# Patient Record
Sex: Female | Born: 1985 | Hispanic: Yes | Smoking: Never smoker
Health system: Southern US, Community
[De-identification: ages and names within clinical notes are randomized; demographics above are authoritative.]

## PROBLEM LIST (undated history)

## (undated) DIAGNOSIS — O4403 Placenta previa specified as without hemorrhage, third trimester: Secondary | ICD-10-CM

## (undated) DIAGNOSIS — Z789 Other specified health status: Secondary | ICD-10-CM

---

## 2009-09-28 ENCOUNTER — Emergency Department: Payer: Self-pay | Admitting: Internal Medicine

## 2009-09-30 ENCOUNTER — Ambulatory Visit: Payer: Self-pay | Admitting: Internal Medicine

## 2009-12-13 ENCOUNTER — Ambulatory Visit: Payer: Self-pay | Admitting: Family Medicine

## 2010-05-22 ENCOUNTER — Inpatient Hospital Stay: Payer: Self-pay

## 2016-04-13 ENCOUNTER — Emergency Department
Admission: EM | Admit: 2016-04-13 | Discharge: 2016-04-13 | Disposition: A | Discharge status: Home / Self Care | Payer: Self-pay | Attending: Emergency Medicine | Admitting: Emergency Medicine

## 2016-04-13 ENCOUNTER — Emergency Department: Payer: Self-pay

## 2016-04-13 ENCOUNTER — Encounter: Payer: Self-pay | Admitting: Emergency Medicine

## 2016-04-13 DIAGNOSIS — R52 Pain, unspecified: Secondary | ICD-10-CM

## 2016-04-13 DIAGNOSIS — R58 Hemorrhage, not elsewhere classified: Secondary | ICD-10-CM

## 2016-04-13 DIAGNOSIS — Z3A01 Less than 8 weeks gestation of pregnancy: Secondary | ICD-10-CM | POA: Insufficient documentation

## 2016-04-13 DIAGNOSIS — O209 Hemorrhage in early pregnancy, unspecified: Secondary | ICD-10-CM | POA: Insufficient documentation

## 2016-04-13 DIAGNOSIS — O2391 Unspecified genitourinary tract infection in pregnancy, first trimester: Secondary | ICD-10-CM | POA: Insufficient documentation

## 2016-04-13 DIAGNOSIS — N390 Urinary tract infection, site not specified: Secondary | ICD-10-CM

## 2016-04-13 LAB — TYPE AND SCREEN
ABO/RH(D): A POS
Antibody Screen: NEGATIVE

## 2016-04-13 LAB — CBC WITH DIFFERENTIAL/PLATELET
BASOS ABS: 0 10*3/uL (ref 0–0.1)
Basophils Relative: 0 %
EOS PCT: 1 %
Eosinophils Absolute: 0 10*3/uL (ref 0–0.7)
HEMATOCRIT: 37.1 % (ref 35.0–47.0)
Hemoglobin: 12.6 g/dL (ref 12.0–16.0)
LYMPHS ABS: 1.3 10*3/uL (ref 1.0–3.6)
LYMPHS PCT: 18 %
MCH: 28.1 pg (ref 26.0–34.0)
MCHC: 34.1 g/dL (ref 32.0–36.0)
MCV: 82.4 fL (ref 80.0–100.0)
MONO ABS: 0.6 10*3/uL (ref 0.2–0.9)
MONOS PCT: 8 %
NEUTROS ABS: 5.4 10*3/uL (ref 1.4–6.5)
Neutrophils Relative %: 73 %
Platelets: 175 10*3/uL (ref 150–440)
RBC: 4.5 MIL/uL (ref 3.80–5.20)
RDW: 14.3 % (ref 11.5–14.5)
WBC: 7.4 10*3/uL (ref 3.6–11.0)

## 2016-04-13 LAB — COMPREHENSIVE METABOLIC PANEL
ALBUMIN: 4.1 g/dL (ref 3.5–5.0)
ALT: 19 U/L (ref 14–54)
ANION GAP: 6 (ref 5–15)
AST: 16 U/L (ref 15–41)
Alkaline Phosphatase: 42 U/L (ref 38–126)
BUN: 13 mg/dL (ref 6–20)
CHLORIDE: 105 mmol/L (ref 101–111)
CO2: 24 mmol/L (ref 22–32)
Calcium: 9.2 mg/dL (ref 8.9–10.3)
Creatinine, Ser: 0.59 mg/dL (ref 0.44–1.00)
GFR calc Af Amer: >60 mL/min (ref >60)
GFR calc non Af Amer: >60 mL/min (ref >60)
GLUCOSE: 94 mg/dL (ref 65–99)
POTASSIUM: 3.8 mmol/L (ref 3.5–5.1)
SODIUM: 135 mmol/L (ref 135–145)
Total Bilirubin: 0.5 mg/dL (ref 0.3–1.2)
Total Protein: 6.6 g/dL (ref 6.5–8.1)

## 2016-04-13 LAB — WET PREP, GENITAL
Clue Cells Wet Prep HPF POC: NONE SEEN
SPERM: NONE SEEN
Trich, Wet Prep: NONE SEEN
Yeast Wet Prep HPF POC: NONE SEEN

## 2016-04-13 LAB — HCG, QUANTITATIVE, PREGNANCY: hCG, Beta Chain, Quant, S: 29832 m[IU]/mL — ABNORMAL HIGH (ref <5)

## 2016-04-13 LAB — URINALYSIS COMPLETE WITH MICROSCOPIC (ARMC ONLY)
BILIRUBIN URINE: NEGATIVE
Glucose, UA: NEGATIVE mg/dL
Ketones, ur: NEGATIVE mg/dL
Nitrite: NEGATIVE
PH: 8 (ref 5.0–8.0)
PROTEIN: NEGATIVE mg/dL
Specific Gravity, Urine: 1.014 (ref 1.005–1.030)

## 2016-04-13 LAB — CHLAMYDIA/NGC RT PCR (ARMC ONLY)
CHLAMYDIA TR: NOT DETECTED
N GONORRHOEAE: NOT DETECTED

## 2016-04-13 MED ORDER — CEPHALEXIN 500 MG PO CAPS: 500.0000 mg | ORAL_CAPSULE | Freq: Four times a day (QID) | ORAL | 0 refills | Status: AC

## 2016-04-13 NOTE — ED Provider Notes (Signed)
[facility] Emergency Department Provider Note   ____________________________________________   First MD Initiated Contact with Patient 04/13/16 [phone]     (approximate)  I have reviewed the triage vital signs and the nursing notes.   HISTORY  Chief Complaint Vaginal Bleeding and Back Pain    HPI Christine Delgado is a 30 y.o. female patient reports a little bit of low back pain early this morning and spotting what once was pink and the other time when she wiped after urinating was brown. She has no further pain no bleeding at present. She had a positive pregnancy test at home. She is worried she might have miscarried. She is gravida 21 live child at home   History reviewed. No pertinent past medical history.  There are no active problems to display for this patient.   History reviewed. No pertinent surgical history.  Prior to Admission medications   Medication Sig Start Date End Date Taking? Authorizing Provider  cephALEXin (KEFLEX) 500 MG capsule Take 1 capsule (500 mg total) by mouth 4 (four) times daily. 04/13/16 04/23/16  Arnaldo NatalPaul F Malinda, MD    Allergies Review of patient's allergies indicates no known allergies.  No family history on file.  Social History Social History  Substance Use Topics  . Smoking status: Never Smoker  . Smokeless tobacco: Never Used  . Alcohol use No    Review of Systems Constitutional: No fever/chills Eyes: No visual changes. ENT: No sore throat. Cardiovascular: Denies chest pain. Respiratory: Denies shortness of breath. Gastrointestinal: No abdominal pain.  No nausea, no vomiting.  No diarrhea.  No constipation. Genitourinary: Negative for dysuria. Musculoskeletal: Negative for back painAt present. Skin: Negative for rash. Neurological: Negative for headaches, focal weakness or numbness.  10-point ROS otherwise negative.  ____________________________________________   PHYSICAL EXAM:  VITAL SIGNS: ED  Triage Vitals  Enc Vitals Group     BP 04/13/16 0843 137/88     Pulse Rate 04/13/16 0843 91     Resp 04/13/16 0843 16     Temp 04/13/16 0843 97.5 F (36.4 C)     Temp Source 04/13/16 0843 Oral     SpO2 04/13/16 0843 99 %     Weight 04/13/16 0843 145 lb (65.8 kg)     Height 04/13/16 0843 5\' 4"  (1.626 m)     Head Circumference --      Peak Flow --      Pain Score 04/13/16 0845 4     Pain Loc --      Pain Edu? --      Excl. in GC? --     Constitutional: Alert and oriented. Well appearing and in no acute distress. Eyes: Conjunctivae are normal. PERRL. EOMI. Head: Atraumatic. Nose: No congestion/rhinnorhea. Mouth/Throat: Mucous membranes are moist.  Oropharynx non-erythematous. Neck: No stridor. Cardiovascular: Normal rate, regular rhythm. Grossly normal heart sounds.  Good peripheral circulation. Respiratory: Normal respiratory effort.  No retractions. Lungs CTAB. Gastrointestinal: Soft and nontender. No distention. No abdominal bruits. No CVA tenderness. Genitourinary: Normal peritoneum. Very small amount of dark blood deep in the vagina. No cervical motion tenderness no adnexal tenderness uterus is normal size Musculoskeletal: No lower extremity tenderness nor edema.  No joint effusions. Neurologic:  Normal speech and language. No gross focal neurologic deficits are appreciated. No gait instability. Skin:  Skin is warm, dry and intact. No rash noted.   ____________________________________________   LABS (all labs ordered are listed, but only abnormal results are displayed)  Labs Reviewed  WET  PREP, GENITAL - Abnormal; Notable for the following:       Result Value   WBC, Wet Prep HPF POC FEW (*)    All other components within normal limits  URINALYSIS COMPLETEWITH MICROSCOPIC ([facility] ONLY) - Abnormal; Notable for the following:    Color, Urine YELLOW (*)    APPearance CLOUDY (*)    Hgb urine dipstick 2+ (*)    Leukocytes, UA 3+ (*)    Bacteria, UA RARE (*)    Squamous  Epithelial / LPF 6-30 (*)    All other components within normal limits  HCG, QUANTITATIVE, PREGNANCY - Abnormal; Notable for the following:    hCG, Beta Chain, Sharene Butters, S 65,784 (*)    All other components within normal limits  CHLAMYDIA/NGC RT PCR ([facility] ONLY)  URINE CULTURE  COMPREHENSIVE METABOLIC PANEL  CBC WITH DIFFERENTIAL/PLATELET  TYPE AND SCREEN   ____________________________________________  EKG   ____________________________________________  RADIOLOGY  Ultrasound read by radiology intrauterine pregnancy viable ____________________________________________   PROCEDURES  Procedure(s) performed: Procedures  Critical Care performed:   ____________________________________________   INITIAL IMPRESSION / ASSESSMENT AND PLAN / ED COURSE  Pertinent labs & imaging results that were available during my care of the patient were reviewed by me and considered in my medical decision making (see chart for details).  Clinical Course     ____________________________________________   FINAL CLINICAL IMPRESSION(S) / ED DIAGNOSES  Final diagnoses:  Vaginal bleeding in pregnancy, first trimester  Urinary tract infection without hematuria, site unspecified      NEW MEDICATIONS STARTED DURING THIS VISIT:  New Prescriptions   CEPHALEXIN (KEFLEX) 500 MG CAPSULE    Take 1 capsule (500 mg total) by mouth 4 (four) times daily.     Note:  This document was prepared using Dragon voice recognition software and may include unintentional dictation errors.    Arnaldo Natal, MD 04/13/16 [phone]

## 2016-04-13 NOTE — ED Notes (Signed)
Pt tearful and reports she is nervous she may have had a miscarriage. MD and RN educated pt on the tests we are running. Pt reports no previous miscarriages, did spot with her first child.

## 2016-04-13 NOTE — ED Triage Notes (Addendum)
Patient states last night after voiding, saw pink on the tissue.  Today c/o low back pain and today seeing dark brown on tissue after voiding.  Patient states had a positive home pregnancy test, LMP:  02/28/16.  Denies c/o abdominal pain.  [facility] and treated for UTI at that time.

## 2016-04-13 NOTE — Discharge Instructions (Signed)
Use the Keflex antibiotic one pill re-times a day to treat what appears to be a bladder infection. Please call Dr. Feliberto GottronSchermerhorn tomorrow to schedule follow-up with him. Please return here for any worse pain heavier bleeding or any other problems, especially fever or vomiting.

## 2016-04-15 LAB — URINE CULTURE

## 2016-04-23 MED ORDER — ESCITALOPRAM OXALATE 10 MG PO TABS
ORAL_TABLET | ORAL | Status: AC
  Filled 2016-04-23: qty 2

## 2016-04-23 MED ORDER — OLANZAPINE 5 MG PO TABS
ORAL_TABLET | ORAL | Status: AC
  Filled 2016-04-23: qty 1

## 2016-04-23 MED ORDER — DIVALPROEX SODIUM 500 MG PO DR TAB
DELAYED_RELEASE_TABLET | ORAL | Status: AC
  Filled 2016-04-23: qty 1

## 2016-05-28 LAB — OB RESULTS CONSOLE HEPATITIS B SURFACE ANTIGEN: Hepatitis B Surface Ag: NEGATIVE

## 2016-05-28 LAB — OB RESULTS CONSOLE VARICELLA ZOSTER ANTIBODY, IGG: Varicella: IMMUNE

## 2016-05-28 LAB — OB RESULTS CONSOLE RUBELLA ANTIBODY, IGM: RUBELLA: IMMUNE

## 2016-05-28 LAB — OB RESULTS CONSOLE RPR: RPR: NONREACTIVE

## 2016-07-15 ENCOUNTER — Other Ambulatory Visit: Payer: Self-pay | Admitting: Family Medicine

## 2016-07-15 ENCOUNTER — Ambulatory Visit
Admission: RE | Admit: 2016-07-15 | Discharge: 2016-07-15 | Disposition: A | Discharge status: Home / Self Care | Payer: Medicaid Other | Source: Ambulatory Visit | Attending: Family Medicine | Admitting: Family Medicine

## 2016-07-15 ENCOUNTER — Emergency Department
Admission: EM | Admit: 2016-07-15 | Discharge: 2016-07-15 | Disposition: A | Discharge status: Home / Self Care | Payer: Self-pay | Attending: Emergency Medicine | Admitting: Emergency Medicine

## 2016-07-15 DIAGNOSIS — Z3A19 19 weeks gestation of pregnancy: Secondary | ICD-10-CM | POA: Insufficient documentation

## 2016-07-15 DIAGNOSIS — O4402 Placenta previa specified as without hemorrhage, second trimester: Secondary | ICD-10-CM | POA: Insufficient documentation

## 2016-07-15 DIAGNOSIS — N939 Abnormal uterine and vaginal bleeding, unspecified: Secondary | ICD-10-CM

## 2016-07-15 DIAGNOSIS — O209 Hemorrhage in early pregnancy, unspecified: Secondary | ICD-10-CM | POA: Diagnosis present

## 2016-07-15 DIAGNOSIS — O4412 Placenta previa with hemorrhage, second trimester: Secondary | ICD-10-CM | POA: Insufficient documentation

## 2016-07-15 LAB — CBC WITH DIFFERENTIAL/PLATELET
Basophils Absolute: 0.1 10*3/uL (ref 0–0.1)
Basophils Relative: 1 %
EOS ABS: 0.1 10*3/uL (ref 0–0.7)
Eosinophils Relative: 1 %
HEMATOCRIT: 36.2 % (ref 35.0–47.0)
HEMOGLOBIN: 12.4 g/dL (ref 12.0–16.0)
LYMPHS ABS: 2.2 10*3/uL (ref 1.0–3.6)
Lymphocytes Relative: 20 %
MCH: 28.4 pg (ref 26.0–34.0)
MCHC: 34.1 g/dL (ref 32.0–36.0)
MCV: 83.1 fL (ref 80.0–100.0)
MONOS PCT: 6 %
Monocytes Absolute: 0.7 10*3/uL (ref 0.2–0.9)
NEUTROS ABS: 8 10*3/uL — AB (ref 1.4–6.5)
NEUTROS PCT: 72 %
Platelets: 190 10*3/uL (ref 150–440)
RBC: 4.36 MIL/uL (ref 3.80–5.20)
RDW: 14.8 % — ABNORMAL HIGH (ref 11.5–14.5)
WBC: 11 10*3/uL (ref 3.6–11.0)

## 2016-07-15 LAB — HCG, QUANTITATIVE, PREGNANCY: hCG, Beta Chain, Quant, S: 19660 m[IU]/mL — ABNORMAL HIGH (ref <5)

## 2016-07-15 NOTE — ED Notes (Signed)
Pt reports bleeding started around 10am this morning.  States filled about 1 pad today.  Reports minor dizziness. 3/10 lower abd pain.

## 2016-07-15 NOTE — ED Triage Notes (Signed)
Pt sent from outpatient ultrasound with complete previa, with vaginal bleeding since 9am this morning.. Pt c/o cramping.. States she is 19weeks 5days..Christine Delgado

## 2016-07-15 NOTE — ED Provider Notes (Signed)
[facility] Emergency Department Provider Note  ____________________________________________   First MD Initiated Contact with Patient 07/15/16 1622     (approximate)  I have reviewed the triage vital signs and the nursing notes.   HISTORY  Chief Complaint Vaginal Bleeding    HPI Christine Delgado is a 30 y.o. female G2P1who is at approximately 9 weeks' gestation and was sent to the emergency department after obtaining an outpatient ultrasound demonstrating complete placenta previa.  She was seen by her [facility] provider [facility] after she developed a small amount of acute onset vaginal bleeding earlier this morning.  The ultrasound was ordered and when the result came back of complete placenta previa she was sent here for further evaluation.  She states she has had no additional bleeding since the initial blood that required the use of one pad.  She urinated after coming to the emergency department and did not see any additional blood.  She had 1 brief episode of abdominal pain on the lower right side that felt like a cramp but that has not continued.  She denies fever/chills, chest pain, shortness of breath, nausea, vomiting, diarrhea, dysuria.   History reviewed. No pertinent past medical history.  There are no active problems to display for this patient.   History reviewed. No pertinent surgical history.  Prior to Admission medications   Not on File    Allergies Patient has no known allergies.  No family history on file.  Social History Social History  Substance Use Topics  . Smoking status: Never Smoker  . Smokeless tobacco: Never Used  . Alcohol use No    Review of Systems Constitutional: No fever/chills Eyes: No visual changes. ENT: No sore throat. Cardiovascular: Denies chest pain. Respiratory: Denies shortness of breath. Gastrointestinal: No abdominal pain.  No nausea, no vomiting.  No diarrhea.  No  constipation. Genitourinary: Negative for dysuria. Small amount of vaginal bleeding at [redacted] weeks gestation Musculoskeletal: Negative for back pain. Skin: Negative for rash. Neurological: Negative for headaches, focal weakness or numbness.  10-point ROS otherwise negative.  ____________________________________________   PHYSICAL EXAM:  VITAL SIGNS: ED Triage Vitals  Enc Vitals Group     BP 07/15/16 1607 115/66     Pulse Rate 07/15/16 1607 88     Resp 07/15/16 1607 18     Temp 07/15/16 1607 98.7 F (37.1 C)     Temp Source 07/15/16 1607 Oral     SpO2 07/15/16 1607 100 %     Weight 07/15/16 1608 152 lb (68.9 kg)     Height 07/15/16 1608 5\' 4"  (1.626 m)     Head Circumference --      Peak Flow --      Pain Score 07/15/16 1609 4     Pain Loc --      Pain Edu? --      Excl. in GC? --     Constitutional: Alert and oriented. Well appearing and in no acute distress. Eyes: Conjunctivae are normal. PERRL. EOMI. Head: Atraumatic. Nose: No congestion/rhinnorhea. Mouth/Throat: Mucous membranes are moist.  Oropharynx non-erythematous. Neck: No stridor.  No meningeal signs.   Cardiovascular: Normal rate, regular rhythm. Good peripheral circulation. Grossly normal heart sounds. Respiratory: Normal respiratory effort.  No retractions. Lungs CTAB. Gastrointestinal: Soft and nontender. No distention.  Genitourinary: Deferred Musculoskeletal: No lower extremity tenderness nor edema. No gross deformities of extremities. Neurologic:  Normal speech and language. No gross focal neurologic deficits are appreciated.  Skin:  Skin  is warm, dry and intact. No rash noted. Psychiatric: Mood and affect are normal. Speech and behavior are normal.  ____________________________________________   LABS (all labs ordered are listed, but only abnormal results are displayed)  Labs Reviewed  CBC WITH DIFFERENTIAL/PLATELET - Abnormal; Notable for the following:       Result Value   RDW 14.8 (*)     Neutro Abs 8.0 (*)    All other components within normal limits  HCG, QUANTITATIVE, PREGNANCY - Abnormal; Notable for the following:    hCG, Beta Chain, Quant, S 19,660 (*)    All other components within normal limits   ____________________________________________  EKG  None - EKG not ordered by ED physician ____________________________________________  RADIOLOGY   US Ob Comp + 14 Wk  Result Date: 07/15/2016 CLINICAL DATA:  Vaginal bleeding. EXAM: OBSTETRICAL ULTRASOUND >14 WKS FINDINGS: Number of Fetuses: 1 Heart Rate:  140 bpm Movement: Present Presentation: Variable Previa: Complete placenta previa.  No evidence of abruption. Placental Location: Posterior Amniotic Fluid (Subjective): Normal. Amniotic Fluid (Objective): Vertical pocket 3.2cm FETAL BIOMETRY BPD:  4.4cm 19w 2d HC:    16.8cm  19w   3d AC:   14.8cm  20w 2 d FL:   3.0cm  19w   2d Current Mean GA: 19w 5d              [place] EDC: 12/04/2016 FETAL ANATOMY Lateral Ventricles: Visualized Thalami/CSP: Visualized Posterior Fossa:  Visualized Nuchal Region: Visualize    NFT= 3.66mm Upper Lip: Visualized Spine: Visualized 4 Chamber Heart on Left: Visualized LVOT: Visualized RVOT: Visualized Stomach on Left: Visualized 3 Vessel Cord: Visualized Cord Insertion site: Visualized Kidneys: Visualized Bladder: Visualized Extremities: Visualized Technically difficult due to: Fetal position Maternal Findings: Cervix:  3.9 cm and closed. IMPRESSION: 1.  Complete placenta previa.  No evidence of abruption. 2. Single viable intrauterine pregnancy at 19 weeks 5 days. Variable presentation. These results will be called to the ordering clinician or representative by the Radiologist Assistant, and communication documented in the PACS or zVision Dashboard. Electronically Signed   By: Maisie Fus  Register   On: 07/15/2016 15:35    ____________________________________________   PROCEDURES  Procedure(s) performed:   Procedures   Critical Care performed:  No ____________________________________________   INITIAL IMPRESSION / ASSESSMENT AND PLAN / ED COURSE  Pertinent labs & imaging results that were available during my care of the patient were reviewed by me and considered in my medical decision making (see chart for details).  I will defer a pelvic exam at this time in order to not cause any more potential trauma.  I called and spoke with the unassigned on-call OB/GYN, Dr. Thomasene Mohair, and we discussed the case.  He agreed that because she is not having any additional bleeding, as well as the fact that she is pretty viable, it is appropriate to discharge her if she is comfortable.  However she has any additional bleeding she should return, and if she is not comfortable or has additional bleeding now, he will bring her in for a brief period of observation.  I will discuss this with the patient.  She is Rh+ based on prior blood work.  Hemoglobin is normal.   Clinical Course as of Jul 15 1748  Tue Jul 15, 2016  1736 The patient has had no additional bleeding over the course of an hour and half in the emergency department.  She is comfortable with the plan to go home and will return to the ED if she  develops more bleeding and she will follow up with Dr. Jean RosenthalJackson.  I gave strict return precautions and I also encouraged her for complete pelvic rest and explained that means no sexual intercourse or any other objects in the vagina, nothing that could cause any trauma, and she understands and agrees.  [CF]    Clinical Course User Index [CF] Loleta Roseory Nitesh Pitstick, MD    ____________________________________________  FINAL CLINICAL IMPRESSION(S) / ED DIAGNOSES  Final diagnoses:  Complete placenta previa with hemorrhage, second trimester     MEDICATIONS GIVEN DURING THIS VISIT:  Medications - No data to display   NEW OUTPATIENT MEDICATIONS STARTED DURING THIS VISIT:  New Prescriptions   No medications on file    Modified Medications   No  medications on file    Discontinued Medications   No medications on file     Note:  This document was prepared using Dragon voice recognition software and may include unintentional dictation errors.    Loleta Roseory Tayven Renteria, MD 07/15/16 [phone]

## 2016-07-15 NOTE — ED Notes (Signed)
Pt discharged home after verbalizing understanding of discharge instructions; nad noted. 

## 2016-07-15 NOTE — Discharge Instructions (Signed)
Please follow up with Dr. Jean RosenthalJackson in about a week.  As we discussed, do not have sexual intercourse on put any objects into your vagina ("pelvic rest").   If you have more bleeding, especially if the bleeding is heavy, go to Dr. Edison PaceJackson's clinic or return immediately to the Emergency Department.

## 2016-09-10 ENCOUNTER — Other Ambulatory Visit: Payer: Self-pay | Admitting: Family Medicine

## 2016-09-10 DIAGNOSIS — O4412 Placenta previa with hemorrhage, second trimester: Secondary | ICD-10-CM

## 2016-09-11 LAB — OB RESULTS CONSOLE HIV ANTIBODY (ROUTINE TESTING): HIV: NONREACTIVE

## 2016-09-16 ENCOUNTER — Ambulatory Visit
Admission: RE | Admit: 2016-09-16 | Discharge: 2016-09-16 | Disposition: A | Discharge status: Home / Self Care | Payer: Self-pay | Source: Ambulatory Visit | Attending: Family Medicine | Admitting: Family Medicine

## 2016-09-16 DIAGNOSIS — O4423 Partial placenta previa NOS or without hemorrhage, third trimester: Secondary | ICD-10-CM | POA: Insufficient documentation

## 2016-09-16 DIAGNOSIS — O4412 Placenta previa with hemorrhage, second trimester: Secondary | ICD-10-CM

## 2016-09-16 DIAGNOSIS — Z3A28 28 weeks gestation of pregnancy: Secondary | ICD-10-CM | POA: Insufficient documentation

## 2016-11-27 ENCOUNTER — Inpatient Hospital Stay
Admission: EM | Admit: 2016-11-27 | Discharge: 2016-11-30 | DRG: 766 | Disposition: A | Discharge status: Home / Self Care | Payer: Medicaid Other | Attending: Obstetrics and Gynecology | Admitting: Obstetrics and Gynecology

## 2016-11-27 DIAGNOSIS — Z6790 Unspecified blood type, Rh positive: Secondary | ICD-10-CM

## 2016-11-27 DIAGNOSIS — D649 Anemia, unspecified: Secondary | ICD-10-CM | POA: Diagnosis present

## 2016-11-27 DIAGNOSIS — O4403 Placenta previa specified as without hemorrhage, third trimester: Principal | ICD-10-CM | POA: Diagnosis present

## 2016-11-27 DIAGNOSIS — O9902 Anemia complicating childbirth: Secondary | ICD-10-CM | POA: Diagnosis present

## 2016-11-27 DIAGNOSIS — Z3A39 39 weeks gestation of pregnancy: Secondary | ICD-10-CM

## 2016-11-28 ENCOUNTER — Inpatient Hospital Stay: Payer: Medicaid Other

## 2016-11-28 ENCOUNTER — Encounter
Admission: EM | Disposition: A | Discharge status: Home / Self Care | Payer: Self-pay | Source: Home / Self Care | Attending: Obstetrics and Gynecology

## 2016-11-28 ENCOUNTER — Encounter: Payer: Self-pay | Admitting: *Deleted

## 2016-11-28 ENCOUNTER — Inpatient Hospital Stay: Payer: Medicaid Other | Admitting: Anesthesiology

## 2016-11-28 DIAGNOSIS — O4403 Placenta previa specified as without hemorrhage, third trimester: Secondary | ICD-10-CM | POA: Diagnosis present

## 2016-11-28 DIAGNOSIS — D649 Anemia, unspecified: Secondary | ICD-10-CM | POA: Diagnosis present

## 2016-11-28 DIAGNOSIS — Z6790 Unspecified blood type, Rh positive: Secondary | ICD-10-CM | POA: Diagnosis not present

## 2016-11-28 DIAGNOSIS — Z3A39 39 weeks gestation of pregnancy: Secondary | ICD-10-CM | POA: Diagnosis not present

## 2016-11-28 DIAGNOSIS — Z3493 Encounter for supervision of normal pregnancy, unspecified, third trimester: Secondary | ICD-10-CM | POA: Diagnosis present

## 2016-11-28 DIAGNOSIS — O9902 Anemia complicating childbirth: Secondary | ICD-10-CM | POA: Diagnosis present

## 2016-11-28 LAB — CBC
HCT: 31.8 % — ABNORMAL LOW (ref 35.0–47.0)
HEMATOCRIT: 36.8 % (ref 35.0–47.0)
HEMOGLOBIN: 12.5 g/dL (ref 12.0–16.0)
Hemoglobin: 10.9 g/dL — ABNORMAL LOW (ref 12.0–16.0)
MCH: 28 pg (ref 26.0–34.0)
MCH: 28.7 pg (ref 26.0–34.0)
MCHC: 34 g/dL (ref 32.0–36.0)
MCHC: 34.3 g/dL (ref 32.0–36.0)
MCV: 82.4 fL (ref 80.0–100.0)
MCV: 83.6 fL (ref 80.0–100.0)
PLATELETS: 122 10*3/uL — AB (ref 150–440)
Platelets: 170 10*3/uL (ref 150–440)
RBC: 4.47 MIL/uL (ref 3.80–5.20)
RBC: 3.8 MIL/uL (ref 3.80–5.20)
RDW: 15.3 % — ABNORMAL HIGH (ref 11.5–14.5)
RDW: 15.2 % — ABNORMAL HIGH (ref 11.5–14.5)
WBC: 7.3 10*3/uL (ref 3.6–11.0)
WBC: 9 10*3/uL (ref 3.6–11.0)

## 2016-11-28 LAB — RAPID HIV SCREEN (HIV 1/2 AB+AG)
HIV 1/2 ANTIBODIES: NONREACTIVE
HIV-1 P24 Antigen - HIV24: NONREACTIVE

## 2016-11-28 LAB — HEPATITIS B SURFACE ANTIGEN: Hepatitis B Surface Ag: NEGATIVE

## 2016-11-28 LAB — TYPE AND SCREEN
ABO/RH(D): A POS
Antibody Screen: NEGATIVE

## 2016-11-28 SURGERY — Surgical Case
Anesthesia: Spinal | Site: Abdomen | Wound class: Clean Contaminated

## 2016-11-28 MED ORDER — OXYTOCIN 40 UNITS IN LACTATED RINGERS INFUSION - SIMPLE MED
INTRAVENOUS | Status: AC
  Filled 2016-11-28: qty 1000

## 2016-11-28 MED ORDER — SIMETHICONE 80 MG PO CHEW: 80.0000 mg | CHEWABLE_TABLET | ORAL | Status: DC

## 2016-11-28 MED ORDER — COCONUT OIL OIL: 1.0000 "application " | TOPICAL_OIL | Status: DC | PRN

## 2016-11-28 MED ORDER — ONDANSETRON HCL 4 MG/2ML IJ SOLN: 4.0000 mg | Freq: Four times a day (QID) | INTRAMUSCULAR | Status: DC | PRN

## 2016-11-28 MED ORDER — LACTATED RINGERS IV SOLN
500.0000 mL | INTRAVENOUS | Status: DC | PRN
  Administered 2016-11-28: 1000 mL via INTRAVENOUS

## 2016-11-28 MED ORDER — BUPIVACAINE IN DEXTROSE 0.75-8.25 % IT SOLN
INTRATHECAL | Status: DC | PRN
  Administered 2016-11-28: 1.7 mL via INTRATHECAL

## 2016-11-28 MED ORDER — DIBUCAINE 1 % RE OINT: 1.0000 "application " | TOPICAL_OINTMENT | RECTAL | Status: DC | PRN

## 2016-11-28 MED ORDER — SODIUM CHLORIDE 0.9% FLUSH: 3.0000 mL | INTRAVENOUS | Status: DC | PRN

## 2016-11-28 MED ORDER — TETANUS-DIPHTH-ACELL PERTUSSIS 5-2.5-18.5 LF-MCG/0.5 IM SUSP
0.5000 mL | Freq: Once | INTRAMUSCULAR | Status: DC
  Filled 2016-11-28: qty 0.5

## 2016-11-28 MED ORDER — MORPHINE SULFATE (PF) 0.5 MG/ML IJ SOLN
INTRAMUSCULAR | Status: AC
  Filled 2016-11-28: qty 10

## 2016-11-28 MED ORDER — DIPHENHYDRAMINE HCL 25 MG PO CAPS: 25.0000 mg | ORAL_CAPSULE | Freq: Four times a day (QID) | ORAL | Status: DC | PRN

## 2016-11-28 MED ORDER — SODIUM CHLORIDE 0.9 % IV SOLN
INTRAVENOUS | Status: DC | PRN
  Administered 2016-11-28: 70 mL

## 2016-11-28 MED ORDER — PRENATAL MULTIVITAMIN CH
1.0000 | ORAL_TABLET | Freq: Every day | ORAL | Status: DC
  Administered 2016-11-28 – 2016-11-30 (×3): 1 via ORAL
  Filled 2016-11-28 (×3): qty 1

## 2016-11-28 MED ORDER — SCOPOLAMINE 1 MG/3DAYS TD PT72
1.0000 | MEDICATED_PATCH | Freq: Once | TRANSDERMAL | Status: DC
  Administered 2016-11-28: 1.5 mg via TRANSDERMAL
  Filled 2016-11-28: qty 1

## 2016-11-28 MED ORDER — BUPIVACAINE HCL (PF) 0.25 % IJ SOLN
INTRAMUSCULAR | Status: DC | PRN
  Administered 2016-11-28: 30 mL

## 2016-11-28 MED ORDER — BISACODYL 10 MG RE SUPP: 10.0000 mg | Freq: Every day | RECTAL | Status: DC | PRN

## 2016-11-28 MED ORDER — OXYCODONE-ACETAMINOPHEN 5-325 MG PO TABS: 2.0000 | ORAL_TABLET | ORAL | Status: DC | PRN

## 2016-11-28 MED ORDER — KETOROLAC TROMETHAMINE 30 MG/ML IJ SOLN
30.0000 mg | Freq: Four times a day (QID) | INTRAMUSCULAR | Status: AC
  Filled 2016-11-28 (×2): qty 1

## 2016-11-28 MED ORDER — TERBUTALINE SULFATE 1 MG/ML IJ SOLN
INTRAMUSCULAR | Status: AC
  Administered 2016-11-28: 1 mg
  Filled 2016-11-28: qty 1

## 2016-11-28 MED ORDER — MENTHOL 3 MG MT LOZG
1.0000 | LOZENGE | OROMUCOSAL | Status: DC | PRN
  Filled 2016-11-28: qty 9

## 2016-11-28 MED ORDER — WITCH HAZEL-GLYCERIN EX PADS: 1.0000 "application " | MEDICATED_PAD | CUTANEOUS | Status: DC | PRN

## 2016-11-28 MED ORDER — OXYCODONE-ACETAMINOPHEN 5-325 MG PO TABS: 1.0000 | ORAL_TABLET | ORAL | Status: DC | PRN

## 2016-11-28 MED ORDER — SIMETHICONE 80 MG PO CHEW
80.0000 mg | CHEWABLE_TABLET | Freq: Three times a day (TID) | ORAL | Status: DC
  Administered 2016-11-28 – 2016-11-30 (×8): 80 mg via ORAL
  Filled 2016-11-28 (×8): qty 1

## 2016-11-28 MED ORDER — NALBUPHINE HCL 10 MG/ML IJ SOLN: 5.0000 mg | Freq: Once | INTRAMUSCULAR | Status: DC | PRN

## 2016-11-28 MED ORDER — ACETAMINOPHEN 325 MG PO TABS: 650.0000 mg | ORAL_TABLET | ORAL | Status: DC | PRN

## 2016-11-28 MED ORDER — SIMETHICONE 80 MG PO CHEW: 80.0000 mg | CHEWABLE_TABLET | ORAL | Status: DC | PRN

## 2016-11-28 MED ORDER — BUPIVACAINE LIPOSOME 1.3 % IJ SUSP: 20.0000 mL | Freq: Once | INTRAMUSCULAR | Status: DC

## 2016-11-28 MED ORDER — ONDANSETRON HCL 4 MG/2ML IJ SOLN: 4.0000 mg | Freq: Three times a day (TID) | INTRAMUSCULAR | Status: DC | PRN

## 2016-11-28 MED ORDER — DEXTROSE 5 % IV SOLN
1.0000 ug/kg/h | INTRAVENOUS | Status: DC | PRN
  Filled 2016-11-28: qty 2

## 2016-11-28 MED ORDER — BUPIVACAINE HCL (PF) 0.5 % IJ SOLN
INTRAMUSCULAR | Status: AC
  Filled 2016-11-28: qty 30

## 2016-11-28 MED ORDER — ONDANSETRON HCL 4 MG/2ML IJ SOLN
INTRAMUSCULAR | Status: AC
  Filled 2016-11-28: qty 2

## 2016-11-28 MED ORDER — OXYTOCIN 40 UNITS IN LACTATED RINGERS INFUSION - SIMPLE MED
2.5000 [IU]/h | INTRAVENOUS | Status: AC
  Administered 2016-11-28: 2.5 [IU]/h via INTRAVENOUS

## 2016-11-28 MED ORDER — DIPHENHYDRAMINE HCL 50 MG/ML IJ SOLN: 12.5000 mg | INTRAMUSCULAR | Status: DC | PRN

## 2016-11-28 MED ORDER — FENTANYL CITRATE (PF) 100 MCG/2ML IJ SOLN: 25.0000 ug | INTRAMUSCULAR | Status: DC | PRN

## 2016-11-28 MED ORDER — MEPERIDINE HCL 25 MG/ML IJ SOLN: 6.2500 mg | INTRAMUSCULAR | Status: DC | PRN

## 2016-11-28 MED ORDER — NALOXONE HCL 0.4 MG/ML IJ SOLN: 0.4000 mg | INTRAMUSCULAR | Status: DC | PRN

## 2016-11-28 MED ORDER — KETOROLAC TROMETHAMINE 30 MG/ML IJ SOLN
30.0000 mg | Freq: Four times a day (QID) | INTRAMUSCULAR | Status: DC | PRN
  Filled 2016-11-28: qty 1

## 2016-11-28 MED ORDER — LACTATED RINGERS IV SOLN
INTRAVENOUS | Status: DC
  Administered 2016-11-29 (×2): via INTRAVENOUS

## 2016-11-28 MED ORDER — IBUPROFEN 600 MG PO TABS
600.0000 mg | ORAL_TABLET | Freq: Four times a day (QID) | ORAL | Status: DC
  Administered 2016-11-29 – 2016-11-30 (×7): 600 mg via ORAL
  Filled 2016-11-28 (×7): qty 1

## 2016-11-28 MED ORDER — PROPOFOL 10 MG/ML IV BOLUS
INTRAVENOUS | Status: AC
  Filled 2016-11-28: qty 20

## 2016-11-28 MED ORDER — MEASLES, MUMPS & RUBELLA VAC ~~LOC~~ INJ
0.5000 mL | INJECTION | Freq: Once | SUBCUTANEOUS | Status: DC
  Filled 2016-11-28: qty 0.5

## 2016-11-28 MED ORDER — DIPHENHYDRAMINE HCL 25 MG PO CAPS: 25.0000 mg | ORAL_CAPSULE | ORAL | Status: DC | PRN

## 2016-11-28 MED ORDER — SODIUM CHLORIDE 0.9 % IJ SOLN
INTRAMUSCULAR | Status: AC
  Filled 2016-11-28: qty 50

## 2016-11-28 MED ORDER — LIDOCAINE HCL (PF) 1 % IJ SOLN: 30.0000 mL | INTRAMUSCULAR | Status: DC | PRN

## 2016-11-28 MED ORDER — OXYTOCIN 40 UNITS IN LACTATED RINGERS INFUSION - SIMPLE MED
2.5000 [IU]/h | INTRAVENOUS | Status: DC
  Administered 2016-11-28: 800 mL via INTRAVENOUS

## 2016-11-28 MED ORDER — CEFAZOLIN SODIUM-DEXTROSE 2-4 GM/100ML-% IV SOLN
2.0000 g | INTRAVENOUS | Status: AC
  Administered 2016-11-28: 2 g via INTRAVENOUS
  Filled 2016-11-28: qty 100

## 2016-11-28 MED ORDER — FLEET ENEMA 7-19 GM/118ML RE ENEM: 1.0000 | ENEMA | Freq: Every day | RECTAL | Status: DC | PRN

## 2016-11-28 MED ORDER — BUTORPHANOL TARTRATE 1 MG/ML IJ SOLN: 1.0000 mg | INTRAMUSCULAR | Status: DC | PRN

## 2016-11-28 MED ORDER — MORPHINE SULFATE (PF) 0.5 MG/ML IJ SOLN
INTRAMUSCULAR | Status: DC | PRN
  Administered 2016-11-28: .2 mg via EPIDURAL

## 2016-11-28 MED ORDER — BUPIVACAINE LIPOSOME 1.3 % IJ SUSP
INTRAMUSCULAR | Status: AC
  Filled 2016-11-28: qty 20

## 2016-11-28 MED ORDER — ONDANSETRON HCL 4 MG/2ML IJ SOLN: 4.0000 mg | Freq: Once | INTRAMUSCULAR | Status: DC | PRN

## 2016-11-28 MED ORDER — SOD CITRATE-CITRIC ACID 500-334 MG/5ML PO SOLN
30.0000 mL | ORAL | Status: DC | PRN
  Administered 2016-11-28: 30 mL via ORAL
  Filled 2016-11-28: qty 15

## 2016-11-28 MED ORDER — NALBUPHINE HCL 10 MG/ML IJ SOLN: 5.0000 mg | INTRAMUSCULAR | Status: DC | PRN

## 2016-11-28 MED ORDER — OXYTOCIN BOLUS FROM INFUSION: 500.0000 mL | Freq: Once | INTRAVENOUS | Status: DC

## 2016-11-28 MED ORDER — EPHEDRINE SULFATE 50 MG/ML IJ SOLN
INTRAMUSCULAR | Status: DC | PRN
  Administered 2016-11-28 (×2): 5 mg via INTRAVENOUS

## 2016-11-28 MED ORDER — LACTATED RINGERS IV SOLN
INTRAVENOUS | Status: DC
  Administered 2016-11-28 (×2): via INTRAVENOUS

## 2016-11-28 MED ORDER — PHENYLEPHRINE HCL 10 MG/ML IJ SOLN
INTRAMUSCULAR | Status: DC | PRN
  Administered 2016-11-28 (×15): 100 ug via INTRAVENOUS

## 2016-11-28 MED ORDER — SENNOSIDES-DOCUSATE SODIUM 8.6-50 MG PO TABS
2.0000 | ORAL_TABLET | ORAL | Status: DC
  Administered 2016-11-29 – 2016-11-30 (×2): 2 via ORAL
  Filled 2016-11-28 (×2): qty 2

## 2016-11-28 MED ORDER — OXYTOCIN 10 UNIT/ML IJ SOLN
INTRAMUSCULAR | Status: AC
  Filled 2016-11-28: qty 1

## 2016-11-28 SURGICAL SUPPLY — 24 items
BARRIER ADHS 3X4 INTERCEED (GAUZE/BANDAGES/DRESSINGS) ×3 IMPLANT
CANISTER SUCT 3000ML (MISCELLANEOUS) ×3 IMPLANT
CATH KIT ON-Q SILVERSOAK 5IN (CATHETERS) IMPLANT
CHLORAPREP W/TINT 26ML (MISCELLANEOUS) ×6 IMPLANT
DRSG TELFA 3X8 NADH (GAUZE/BANDAGES/DRESSINGS) IMPLANT
ELECT REM PT RETURN 9FT ADLT (ELECTROSURGICAL) ×3
ELECTRODE REM PT RTRN 9FT ADLT (ELECTROSURGICAL) ×1 IMPLANT
GAUZE SPONGE 4X4 12PLY STRL (GAUZE/BANDAGES/DRESSINGS) IMPLANT
GOWN STRL REUS W/ TWL LRG LVL3 (GOWN DISPOSABLE) ×3 IMPLANT
GOWN STRL REUS W/TWL LRG LVL3 (GOWN DISPOSABLE) ×6
NS IRRIG 1000ML POUR BTL (IV SOLUTION) ×3 IMPLANT
PAD OB MATERNITY 4.3X12.25 (PERSONAL CARE ITEMS) ×6 IMPLANT
PAD PREP 24X41 OB/GYN DISP (PERSONAL CARE ITEMS) ×3 IMPLANT
SPONGE LAP 18X18 5 PK (GAUZE/BANDAGES/DRESSINGS) ×3 IMPLANT
SUT MNCRL 4-0 (SUTURE)
SUT MNCRL 4-0 27XMFL (SUTURE)
SUT PDS AB 1 TP1 96 (SUTURE) IMPLANT
SUT PLAIN 2 0 XLH (SUTURE) IMPLANT
SUT PLAIN GUT 2-0 30 C14 SG823 (SUTURE)
SUT VIC AB 0 CT1 36 (SUTURE) ×9 IMPLANT
SUT VIC AB 3-0 SH 27 (SUTURE) ×2
SUT VIC AB 3-0 SH 27X BRD (SUTURE) ×1 IMPLANT
SUTURE MNCRL 4-0 27XMF (SUTURE) IMPLANT
SUTURE PLN GUT2-0 30 C14 SG823 (SUTURE) IMPLANT

## 2016-11-28 NOTE — OB Triage Note (Signed)
Recvd pt from ED. Pt c/o contractions that started at 5:00 and are 5 min apart. Pt states she had some spotting when she wiped. Denies LOF. Feeling baby move ok. No intercourse in the past 24 hours.

## 2016-11-28 NOTE — Anesthesia Post-op Follow-up Note (Cosign Needed)
Anesthesia QCDR form completed.        

## 2016-11-28 NOTE — Discharge Summary (Signed)
Obstetrical Discharge Summary  Patient Name: Christine Delgado DOB: December 13, 1985 MRN: [id number]  Date of Admission: 11/27/2016 Date of Discharge: 11/30/16 Primary OB: [facility]  Gestational Age at Delivery: [redacted]w[redacted]d   Antepartum complications: Complete placenta previa, anemia Admitting Diagnosis: Active labor, with vaginal bleeding Secondary Diagnosis: Patient Active Problem List   Diagnosis Date Noted  . Complete placenta previa nos or without hemorrhage, third trimester 11/28/2016    Complications: None Intrapartum complications/course:  31yo G2P1001 at 39+2wks with complete placenta previa, presenting with vaginal bleeding and contractions q3-5 min with Cat I strip. Previa confirmed by formal ultrasound. Urgent cesarean section performed with appropriate bleeding.  Concern for social situation on admission. Pt contracting at home for 8 hours, husband refused to come to hospital and she drove herself. She was tearful for much of her admission and the relationship strained with him. CSW consult placed.  Date of Delivery: 11/28/16 Delivered By: Christine Douglas, MD MPH Delivery Type: primary cesarean section, low transverse incision Anesthesia: spinal Placenta: Spontaneous Newborn Data: Live born female "Christine Delgado" Birth Weight: 8 lb 6 oz (3800 g) APGAR: 8, 9    Discharge Physical Exam: 11/30/16 BP 105/65 (BP Location: Right Arm)   Pulse 73   Temp 98 F (36.7 C) (Oral)   Resp 17   Ht  (1.6 m)   Wt 78 kg (172 lb)   LMP 02/28/2016   SpO2 98%   BMI 30.47 kg/m   General: NAD CV: RRR Pulm: CTABL, nl effort ABD: s/nd/nt, fundus firm and below the umbilicus Lochia: moderate Incision: c/d/i DVT Evaluation: LE non-ttp, no evidence of DVT on exam.  Hemoglobin  Date Value Ref Range Status  11/28/2016 10.9 (L) 12.0 - 16.0 g/dL Final   HCT  Date Value Ref Range Status  11/28/2016 31.8 (L) 35.0 - 47.0 % Final    Post partum course: uncomplicated  Postpartum Procedures:  None  Disposition: stable, discharge to home. Baby Feeding: breastmilk Baby Disposition: home with mom  Rh Immune globulin given:n/a Rubella vaccine given: n/a Tdap vaccine given in AP or PP setting:  Flu vaccine given in AP or PP setting:   Contraception: thinking about IUID or nexplanon   Prenatal Labs:     Plan:  Christine Delgado was discharged to home in good condition. Follow-up appointment at [facility] for evaluation of postpartum depression at 1-2 weeks, and with [facility] in 2 weeks for postop visit   Discharge Medications: Allergies as of 11/30/2016   No Known Allergies     Medication List    TAKE these medications   docusate sodium 100 MG capsule Commonly known as:  COLACE Take 1 capsule (100 mg total) by mouth 2 (two) times daily.   HYDROcodone-acetaminophen 5-325 MG tablet Commonly known as:  NORCO Take 1 tablet by mouth every 6 (six) hours as needed for moderate pain.   ibuprofen 600 MG tablet Commonly known as:  ADVIL,MOTRIN Take 1 tablet (600 mg total) by mouth every 6 (six) hours.   prenatal multivitamin Tabs tablet Take 1 tablet by mouth daily at 12 noon.         Signed: Zettie Cooley Schermerhorn MD

## 2016-11-28 NOTE — Anesthesia Procedure Notes (Signed)
Procedure Name: MAC Performed by: Hannie Shoe Pre-anesthesia Checklist: Patient identified, Emergency Drugs available, Suction available, Patient being monitored and Timeout performed Oxygen Delivery Method: Nasal cannula       

## 2016-11-28 NOTE — Anesthesia Preprocedure Evaluation (Addendum)
Anesthesia Evaluation  Patient identified by MRN, date of birth, ID band Patient awake    Reviewed: Allergy & Precautions, NPO status , Patient's Chart, lab work & pertinent test results, reviewed documented beta blocker date and time   Airway Mallampati: II  TM Distance: >3 FB     Dental  (+) Chipped   Pulmonary           Cardiovascular      Neuro/Psych    GI/Hepatic   Endo/Other    Renal/GU      Musculoskeletal   Abdominal   Peds  Hematology   Anesthesia Other Findings After discussion with Dr.Beasley and the patient, it was decided to proceed with spinal(rather than Gen'l) She feels the risk of massive bleeding is low in this patient.  Reproductive/Obstetrics                            Anesthesia Physical Anesthesia Plan  ASA: II  Anesthesia Plan:    Post-op Pain Management:    Induction:   Airway Management Planned:   Additional Equipment:   Intra-op Plan:   Post-operative Plan:   Informed Consent: I have reviewed the patients History and Physical, chart, labs and discussed the procedure including the risks, benefits and alternatives for the proposed anesthesia with the patient or authorized representative who has indicated his/her understanding and acceptance.     Plan Discussed with: CRNA  Anesthesia Plan Comments:         Anesthesia Quick Evaluation

## 2016-11-28 NOTE — Anesthesia Postprocedure Evaluation (Signed)
Anesthesia Post Note  Patient: Christine Delgado  Procedure(s) Performed: Procedure(s) (LRB): CESAREAN SECTION (N/A)  Anesthesia Post Evaluation   Last Vitals:  Vitals:   11/28/16 0616 11/28/16 0617  BP:    Pulse: 84 79  Resp: 12 17  Temp:      Last Pain:  Vitals:   11/28/16 0147  TempSrc: Oral  PainSc:                  [name]

## 2016-11-28 NOTE — Anesthesia Post-op Follow-up Note (Signed)
  Anesthesia Pain Follow-up Note  Patient: Christine Delgado  Day #: 1  Date of Follow-up: 11/28/2016 Time: 8:20 AM  Last Vitals:  Vitals:   11/28/16 0616 11/28/16 0617  BP:    Pulse: 84 79  Resp: 12 17  Temp:      Level of Consciousness: alert  Pain: none   Side Effects:None  Catheter Site Exam:clean, dry, no drainage     Plan: D/C from anesthesia care at surgeon's request  [facility]

## 2016-11-28 NOTE — Anesthesia Procedure Notes (Signed)
Spinal  Patient location during procedure: OR Staffing Anesthesiologist: Berdine Addison Performed: anesthesiologist  Preanesthetic Checklist Completed: patient identified, site marked, surgical consent, pre-op evaluation, timeout performed, IV checked and risks and benefits discussed Spinal Block Patient position: sitting Prep: Betadine Patient monitoring: heart rate, cardiac monitor, continuous pulse ox and blood pressure Approach: midline Location: L3-4 Injection technique: single-shot Needle Needle type: Pencil-Tip  Needle gauge: 25 G Needle length: 9 cm Assessment Sensory level: T10 Additional Notes Marcaine 1.78ml and 0.2mg  astromorph.

## 2016-11-28 NOTE — Transfer of Care (Signed)
Immediate Anesthesia Transfer of Care Note  Patient: Christine Delgado  Procedure(s) Performed: Procedure(s): CESAREAN SECTION (N/A)  Patient Location: PACU  Anesthesia Type:Spinal  Level of Consciousness: awake, alert  and oriented  Airway & Oxygen Therapy: Patient Spontanous Breathing  Post-op Assessment: Report given to RN and Post -op Vital signs reviewed and stable  Post vital signs: Reviewed and stable  Last Vitals:  Vitals:   11/28/16 0130 11/28/16 0147  BP:  130/85  Pulse:  (!) 115  Resp:  18  Temp: 36.7 C 36.7 C    Last Pain:  Vitals:   11/28/16 0147  TempSrc: Oral  PainSc:          Complications: No apparent anesthesia complications

## 2016-11-28 NOTE — Anesthesia Postprocedure Evaluation (Signed)
Anesthesia Post Note  Patient: Christine Delgado  Procedure(s) Performed: Procedure(s) (LRB): CESAREAN SECTION (N/A)  Patient location during evaluation: Mother Baby Anesthesia Type: Spinal Level of consciousness: awake, awake and alert and oriented Pain management: pain level controlled Vital Signs Assessment: post-procedure vital signs reviewed and stable Respiratory status: spontaneous breathing, nonlabored ventilation and respiratory function stable Cardiovascular status: blood pressure returned to baseline and stable Postop Assessment: no headache and no backache Anesthetic complications: no     Last Vitals:  Vitals:   11/28/16 0616 11/28/16 0617  BP:    Pulse: 84 79  Resp: 12 17  Temp:      Last Pain:  Vitals:   11/28/16 0147  TempSrc: Oral  PainSc:                  [name]

## 2016-11-28 NOTE — Lactation Note (Signed)
This note was copied from a baby's chart. Lactation Consultation Note  Patient Name: Christine Delgado [id number] Date: 11/28/2016 Reason for consult: Initial assessment Mom encouraged to breastfeed baby on both breasts at a feeding, informed that formula supplementation is not needed, multiple swallows heard. Maternal Data Formula Feeding for Exclusion: No Does the patient have breastfeeding experience prior to this delivery?: Yes  Feeding Feeding Type: Breast Fed  LATCH Score/Interventions Latch: Grasps breast easily, tongue down, lips flanged, rhythmical sucking.  Audible Swallowing: Spontaneous and intermittent  Type of Nipple: Everted at rest and after stimulation  Comfort (Breast/Nipple): Soft / non-tender     Hold (Positioning): Assistance needed to correctly position infant at breast and maintain latch.  LATCH Score: 9  Lactation Tools Discussed/Used WIC Program: No   Consult Status Consult Status: Follow-up Date: 11/29/16 Follow-up type: In-patient    Dyann Kief 11/28/2016, 4:03 PM

## 2016-11-28 NOTE — Op Note (Signed)
  Cesarean Section Procedure Note  Date of procedure: 11/28/2016   Pre-operative Diagnosis: Intrauterine pregnancy at [redacted]w[redacted]d; complete placenta previa; active labor  Post-operative Diagnosis: same, delivered.  Procedure: Primary Low Transverse Cesarean Section through Pfannenstiel incision  Surgeon: Christeen Douglas, MD  Assistant(s):  [address], CST  Anesthesia: Spinal anesthesia  Anesthesiologist: Berdine Addison, MD Anesthesiologist: Berdine Addison, MD CRNA: Malva Cogan, CRNA  Estimated Blood Loss:          Drains: none         Total IV Fluids:  Urine Output:         Specimens: Placenta         Complications:  None; patient tolerated the procedure well.         Disposition: PACU - hemodynamically stable.         Condition: stable  Findings:  A female infant "Molli Hazard: in cephalic presentation. Amniotic fluid - Clear  Birth weight 3800 g.  Apgars of 8 and 9 at one and five minutes respectively.  Intact placenta with a three-vessel cord.  Grossly normal uterus, tubes and ovaries bilaterally. No intraabdominal adhesions were noted. Decidual reaction at the fundus of the uterus, no bleeding  Indications: Complete placenta previa  Procedure Details  The patient was taken to Operating Room, identified as the correct patient and the procedure verified as C-Section Delivery. A formal Time Out was held with all team members present and in agreement.  After induction of anesthesia, the patient was draped and prepped in the usual sterile manner. A Pfannenstiel skin incision was made and carried down through the subcutaneous tissue to the fascia. Fascial incision was made and extended transversely with the Mayo scissors. The fascia was separated from the underlying rectus tissue superiorly and inferiorly. The peritoneum was identified and entered bluntly. Peritoneal incision was extended longitudinally. The utero-vesical peritoneal reflection was incised  transversely and a bladder flap was created digitally.   A low transverse hysterotomy was made. The fetus was delivered atraumatically. The umbilical cord was clamped x2 and cut and the infant was handed to the awaiting pediatricians. The placenta was removed intact and appeared normal, intact, and with a 3-vessel cord.   The uterus was exteriorized and cleared of all clot and debris. The hysterotomy was closed with running sutures of 0-Vicryl. A second imbricating layer was placed with the same suture. Excellent hemostasis was observed. The peritoneal cavity was cleared of all clots and debris. The uterus was returned to the abdomen.   The pelvis was irrigated and again, excellent hemostasis was noted. The fascia was then reapproximated with running sutures of 0 Vicryl.  The subcutaneous tissue was reapproximated with interrupted sutures of 0 Vicry. The skin was reapproximated with absorbable staples. 20ml (in 30 of 0.5% bupivicaine and 50ml of NSS) of liposomal bupivicaine placed in the fascial and skin lines.  Instrument, sponge, and needle counts were correct prior to the abdominal closure and at the conclusion of the case.   The patient tolerated the procedure well and was transferred to the recovery room in stable condition.   Christeen Douglas, MD 11/28/2016

## 2016-11-28 NOTE — H&P (Addendum)
OB ADMISSION/ HISTORY & PHYSICAL:  Admission Date: 11/27/2016 11:53 PM  Admit Diagnosis: Contractions at term; complete placenta previa  Christine Delgado is a 31 y.o. female presenting for painful uterine contractions at term. She started bleeding last night.  Prenatal History: G2P1001 at 39+2wks   EDC : 12/03/2016, Date entered prior to episode creation  Prenatal care at Primary Ob Provider: [facility] Prenatal course complicated by  - Complete placenta previa - hx of hyperemesis  Prenatal Labs: ABO, Rh: --/--/A POS (08/27 0900) Antibody: NEG (08/27 0900) Rubella:   immune RPR:    Non reactive HBsAg:    HIV:   neg GTT: 83 GBS:   unknown  Medical / Surgical History :  Past medical history: History reviewed. No pertinent past medical history.   Past surgical history: History reviewed. No pertinent surgical history.  Family History: History reviewed. No pertinent family history.   Social History:  reports that she has never smoked. She has never used smokeless tobacco. She reports that she does not drink alcohol or use drugs.   Allergies: Patient has no known allergies.    Current Medications at time of admission:  Prior to Admission medications   Medication Sig Start Date End Date Taking? Authorizing Provider  Prenatal Vit-Fe Fumarate-FA (PRENATAL MULTIVITAMIN) TABS tablet Take 1 tablet by mouth daily at 12 noon.   Yes Historical Provider, MD     Review of Systems: Active FM onset of ctx @ 1700 currently every 2-4 minutes bloody show yes   Physical Exam:  VS: Pulse (!) 105, temperature 98 F (36.7 C), temperature source Oral, resp. rate 16, last menstrual period 02/28/2016.  General: alert and oriented, appears in mild distress from contractions Heart: RRR Lungs: Clear lung fields Abdomen: Gravid, soft and non-tender, non-distended Extremities: trace edema  Genitalia / VE:  DEFERRED  FHR: baseline rate 135 / variability mod / accelerations + / neg  decelerations TOCO: q2-38min  Last [place] Today: 11/28/16- Prelim read complete placenta previa Anatomy scan at 19 wks AF wnl, normal anatomy 09/16/16: 28wk ultrasound marginal previa   Assessment: 39+[redacted] weeks gestation Likely early labor FHR category Cat I   Plan:  Complete placenta previa - Will give terbutaline while prep for cesarean section - Patient consented for urgent cesarean section - Type and screen  Admit for active labor Labs pending Continuous fetal monitoring   1. Fetal Well being  - Fetal Tracing: Cat I - Ultrasound:  reviewed, as above - Group B Streptococcus: unknown - Presentation: vtx confirmed by ultrasound   2. Routine OB: - Prenatal labs reviewed, as above - Rh positive  4. Post Partum Planning: - Infant feeding: both - Contraception: desires BTL, but no MA31 papers signed. MA31 papers signed today, will plan for postpartum BTL.   The risks of cesarean section discussed with the patient included but were not limited to: bleeding which may require transfusion or reoperation; infection which may require antibiotics; injury to bowel, bladder, ureters or other surrounding organs; injury to the fetus; need for additional procedures including hysterectomy in the event of a life-threatening hemorrhage; placental abnormalities wth subsequent pregnancies, incisional problems, thromboembolic phenomenon and other postoperative/anesthesia complications. The patient concurred with the proposed plan, giving informed written consent for the procedure.   Patient has been NPO since last night and she will remain NPO for procedure. Anesthesia and OR aware. Preoperative prophylactic antibiotics and SCDs ordered on call to the OR.  To OR when ready.

## 2016-11-29 LAB — RPR: RPR Ser Ql: NONREACTIVE

## 2016-11-30 MED ORDER — DOCUSATE SODIUM 100 MG PO CAPS: 100.0000 mg | ORAL_CAPSULE | Freq: Two times a day (BID) | ORAL | 0 refills | Status: DC

## 2016-11-30 MED ORDER — HYDROCODONE-ACETAMINOPHEN 5-325 MG PO TABS: 1.0000 | ORAL_TABLET | Freq: Four times a day (QID) | ORAL | 0 refills | Status: DC | PRN

## 2016-11-30 MED ORDER — IBUPROFEN 600 MG PO TABS: 600.0000 mg | ORAL_TABLET | Freq: Four times a day (QID) | ORAL | 0 refills | Status: DC
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
## 2016-11-30 NOTE — Progress Notes (Signed)
Discharge instructions complete and prescriptions given. Patient verbalizes understanding of teaching. Patient discharged home at 1400. 

## 2016-11-30 NOTE — Progress Notes (Signed)
  Subjective:   Doing well.  No complaints.  Voiding, ambulating, tolerating regular PO diet, tolerating pain with PO meds. Denies: CP SOB F/C, N/V, calf pain    Objective:  Blood pressure 111/71, pulse 98, temperature 97.4 F (36.3 C), temperature source Oral, resp. rate 20, height  (1.6 m), weight 78 kg (172 lb), last menstrual period 02/28/2016, SpO2 99 %.  General: NAD Pulmonary: no increased work of breathing Abdomen: non-distended, non-tender, fundus firm at level of umbilicus Incision: Extremities: no edema, no erythema, no tenderness  No results found for this or any previous visit (from the past 24 hour(s)). /O24@   Assessment:   31 y.o. G2P0 postoperativeday # 1 from LTCS for previa  Plan:  1) Acute blood loss anemia - hemodynamically stable and asymptomatic - po ferrous sulfate  2) post partum - continue routine care  3) get OOB and walking  4) remove bandages after shower tomorrow  5) Disposition continue inpatient   ----- Ranae Plumber, MD Attending Obstetrician and Gynecologist [facility], Department of OB/GYN [facility]

## 2016-12-01 LAB — SURGICAL PATHOLOGY

## 2016-12-02 ENCOUNTER — Ambulatory Visit
Admission: RE | Admit: 2016-12-02 | Discharge: 2016-12-02 | Disposition: A | Discharge status: Home / Self Care | Payer: Self-pay | Source: Ambulatory Visit | Attending: Obstetrics and Gynecology | Admitting: Obstetrics and Gynecology

## 2016-12-02 ENCOUNTER — Other Ambulatory Visit: Payer: Self-pay | Admitting: Obstetrics and Gynecology

## 2016-12-02 DIAGNOSIS — G8918 Other acute postprocedural pain: Secondary | ICD-10-CM | POA: Insufficient documentation

## 2016-12-02 MED ORDER — IOPAMIDOL (ISOVUE-300) INJECTION 61%
100.0000 mL | Freq: Once | INTRAVENOUS | Status: AC | PRN
Start: 2016-12-02 — End: 2016-12-02
  Administered 2016-12-02: 100 mL via INTRAVENOUS

## 2017-11-21 IMAGING — US US OB FOLLOW-UP
2 series · 13 of 28 positions shown · non-contrast
Comparison: none

CLINICAL DATA: Pregnancy.  Placenta previa.

EXAM:
OBSTETRIC 14+ WK ULTRASOUND FOLLOW-UP

[Series 1: us ob follow-up · 0.23mm/px · 12 of 57 slices shown (1 of 2)]
[im 3/57]
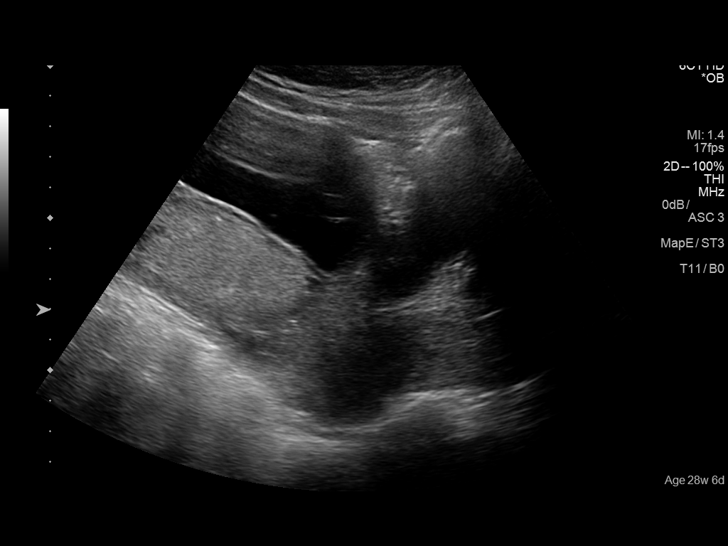
[im 7/57]
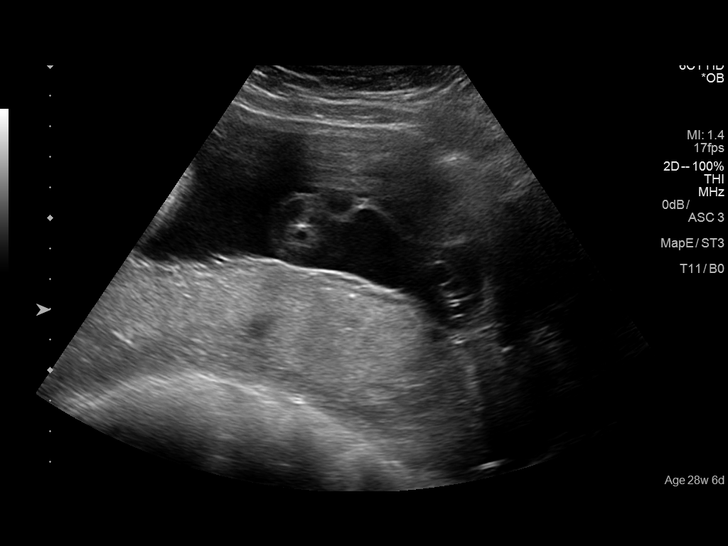
[im 12/57]
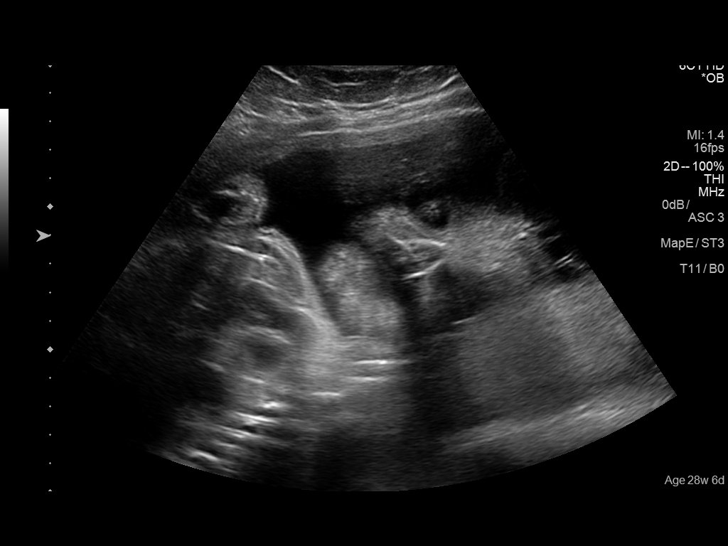
[im 16/57]
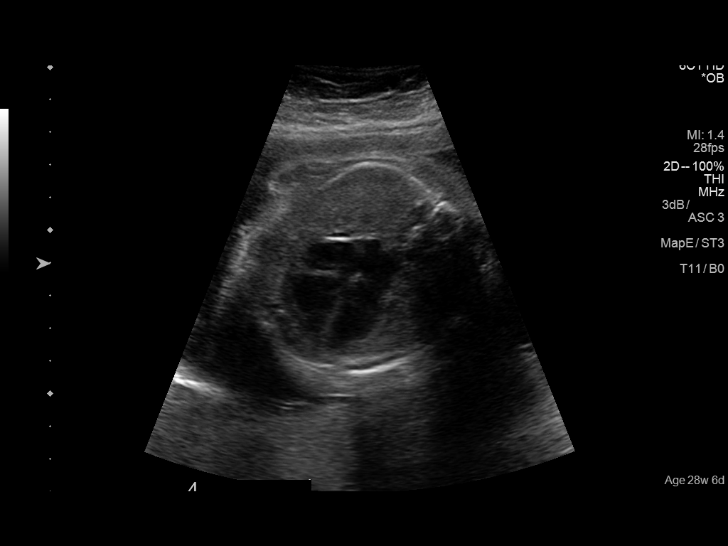
[im 21/57]
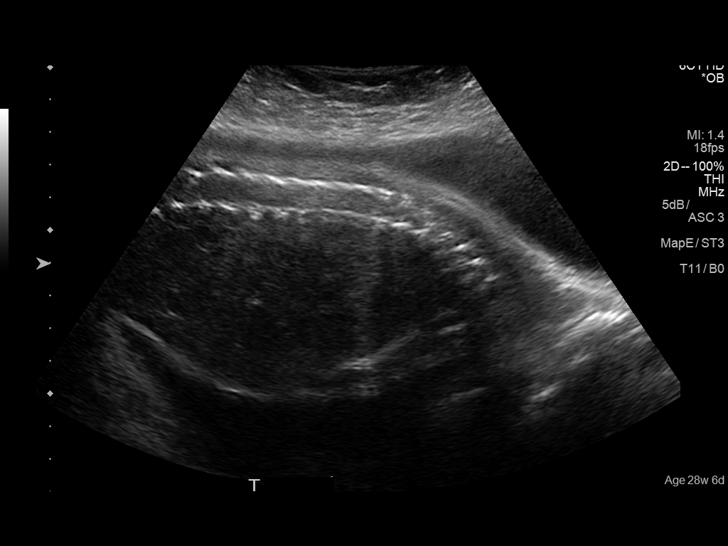
[im 25/57]
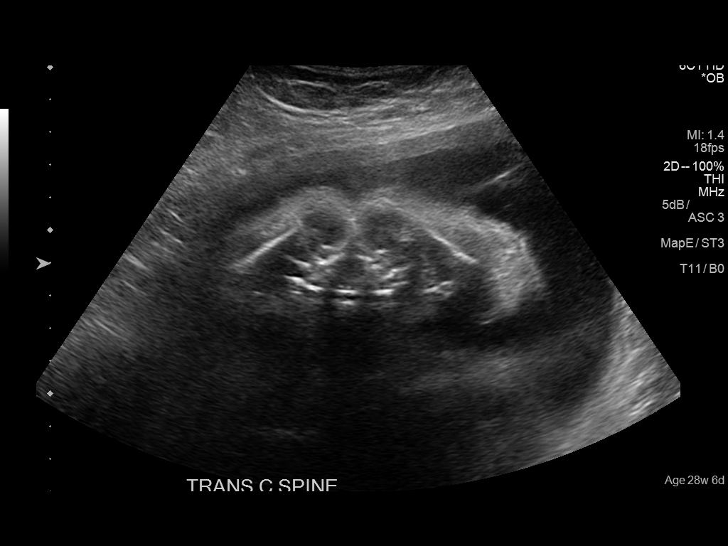
[im 32/57]
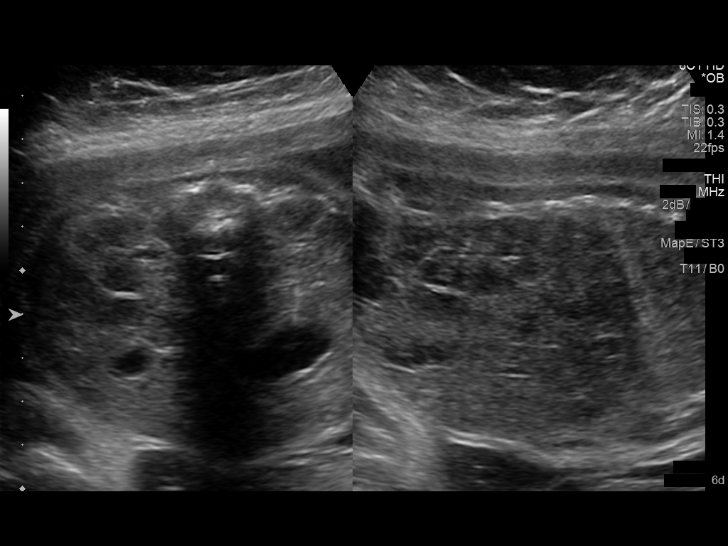
[im 36/57]
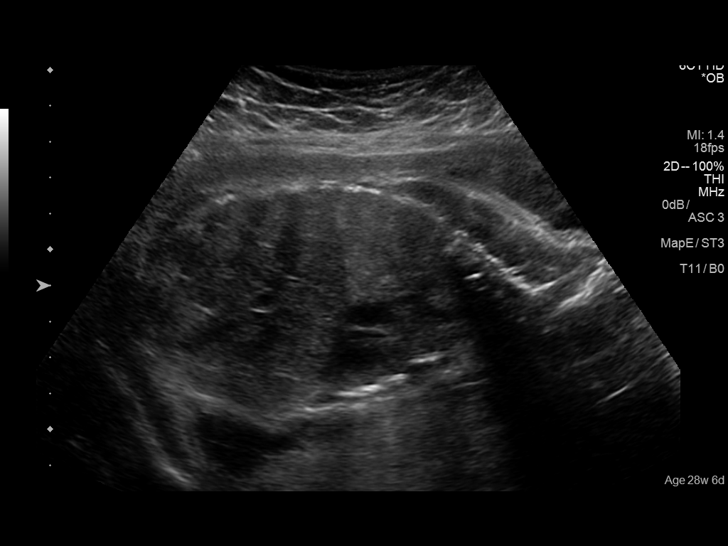
[im 41/57]
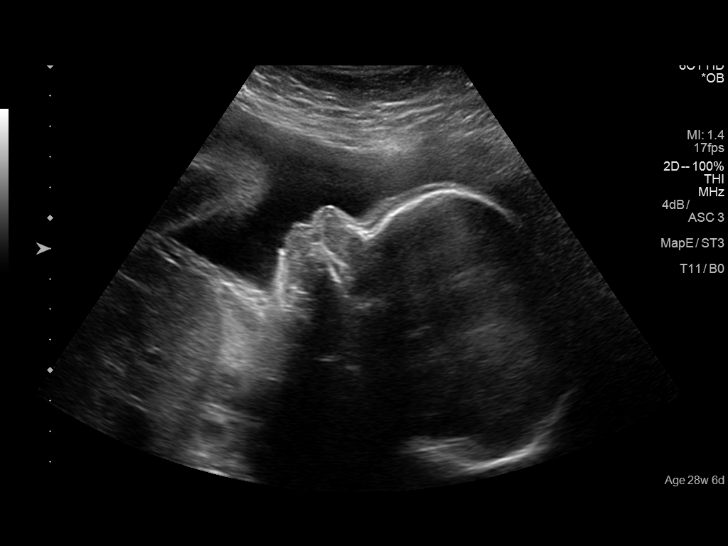
[im 45/57]
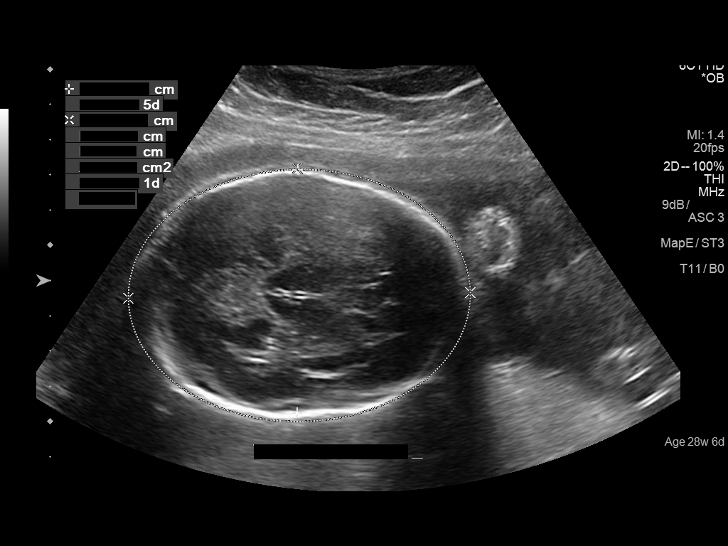
[im 50/57]
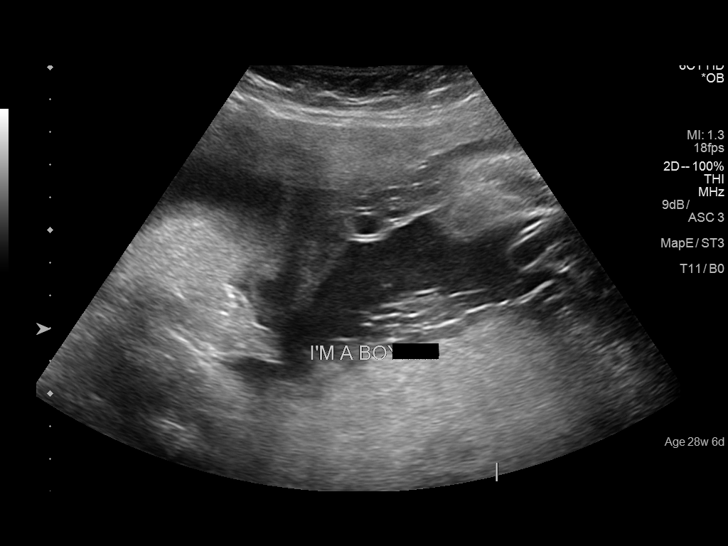
[im 54/57]
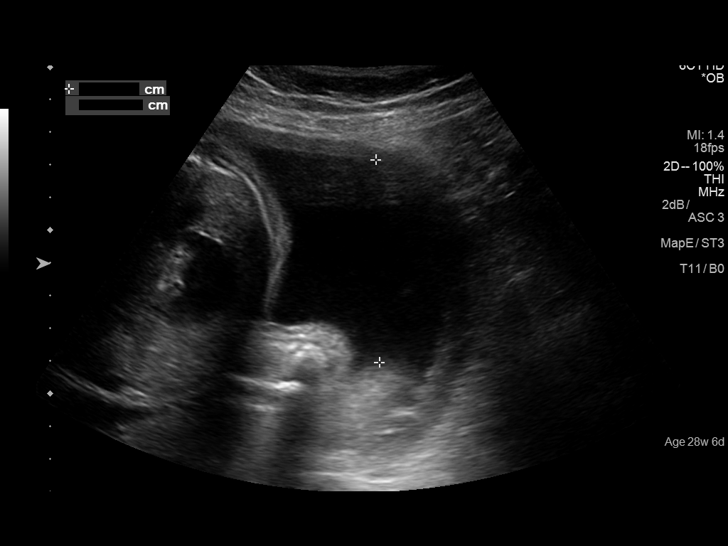

[Series 1001: us ob follow-up · 1 of 4 slices shown (2 of 2)]
[im 1/4]
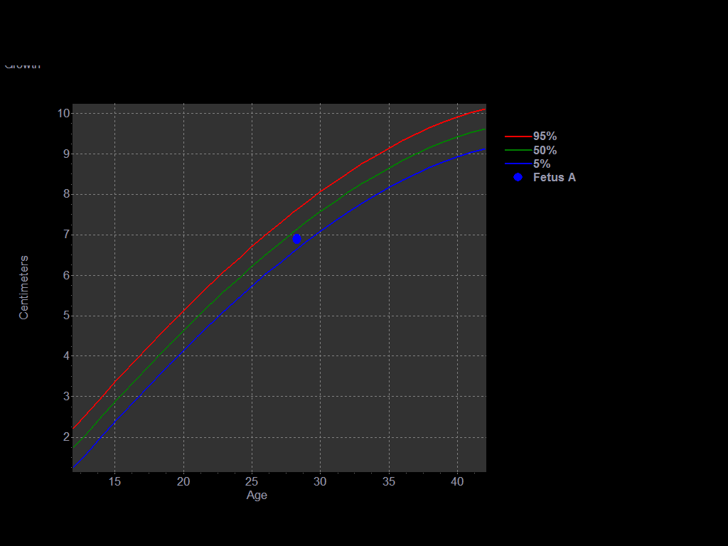

[13 of 28 positions shown; findings below may reference images not displayed]

FINDINGS: Number of Fetuses: 1

Heart Rate:  135 bpm

Movement: Present

Presentation: Variable

Previa: Marginal

Placental Location: Posterior

Amniotic Fluid (Subjective): Normal

Amniotic Fluid (Objective):

AFI 20.2 cm (5%ile= 9.4 cm, 95%= 22.8 cm for 28 wks)

FETAL BIOMETRY

BPD:  6.9cm 27w   5d

HC:    26.7cm  29w   1d

AC:   25.1cm  29w   2d

FL:   5.5cm  29w   0d

Current Mean GA: 28w 6d          US EDC: 12/03/2016

Assigned GA:  28w 2d              Assigned EDC:  12/07/2016

Estimated Fetal Weight:  1331g 68%ile

FETAL ANATOMY

Lateral Ventricles: Visualized

Thalami/CSP: Visualized

Posterior Fossa:  Previously visualized

Nuchal Region:  Previously visualized

Upper Lip: Previously visualized

Spine: Visualized

4 Chamber Heart on Left: Visualized

LVOT: Previously visualized

RVOT: Previously visualized

Stomach on Left: Visualized

3 Vessel Cord: Previously visualized

Cord Insertion site: Previously visualize

Kidneys: Visualized

Bladder: Visualized

Extremities: Previously visualize

MATERNAL FINDINGS:

Cervix: 4 cm and closed.
IMPRESSION: 1. Single viable intrauterine pregnancy at 28 weeks 6 days. Variable
presentation. Fetal heart rate of 135 beats per minute noted.

2. Marginal placenta. Follow-up exam can be obtained for continued
evaluation .

## 2018-02-02 IMAGING — US US OB LIMITED
1 series · 14 of 28 positions shown · non-contrast
Comparison: none

CLINICAL DATA: Acute onset of vaginal bleeding.  Initial encounter.

EXAM:
LIMITED OBSTETRIC ULTRASOUND

[Series 1: us ob limited · 0.26mm/px · 31 acquisitions, 14 frames shown]
[im 2/31]
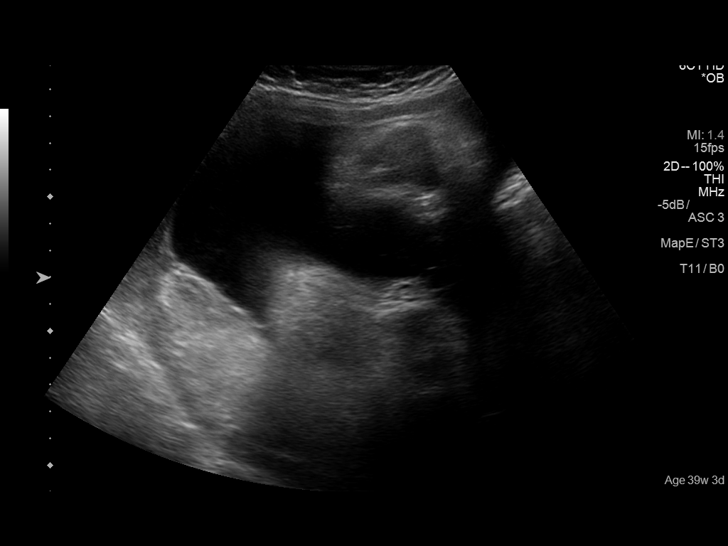
[im 4/31]
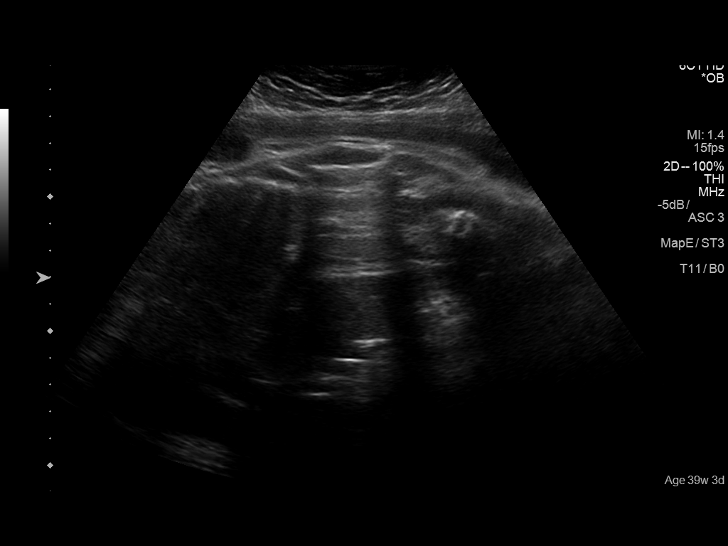
[im 6/31]
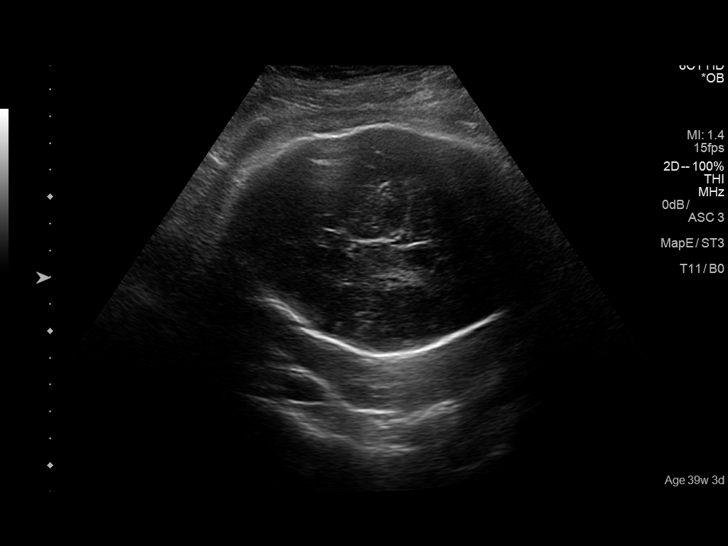
[im 8/31]
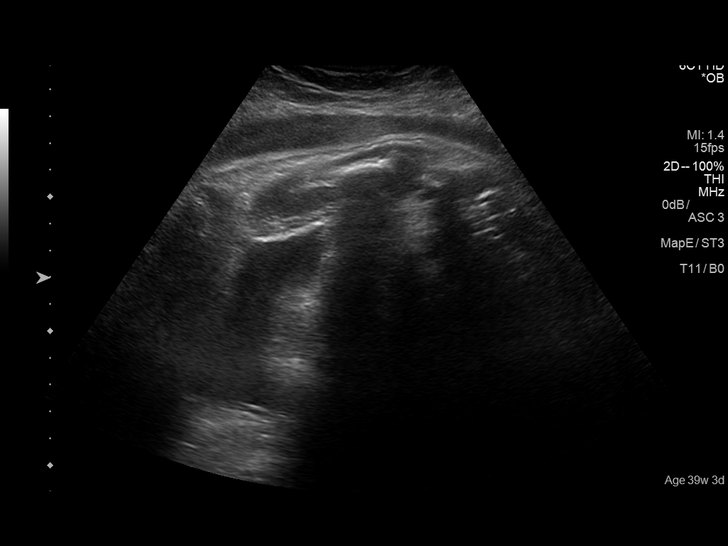
[im 11/31]
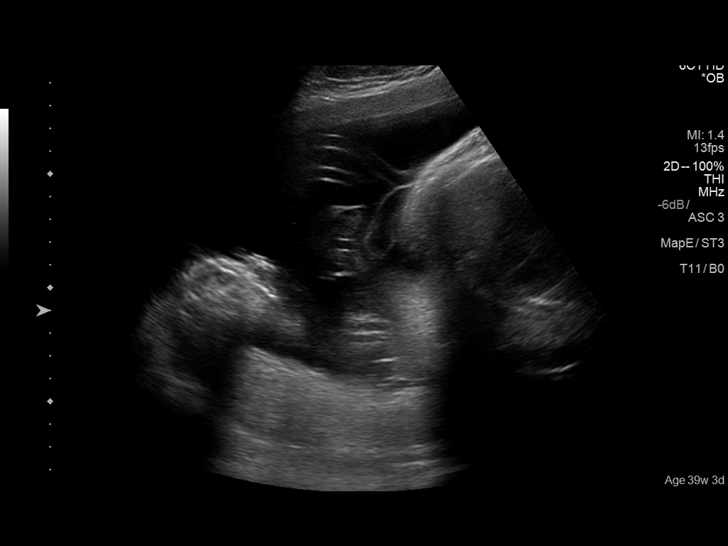
[im 13/31]
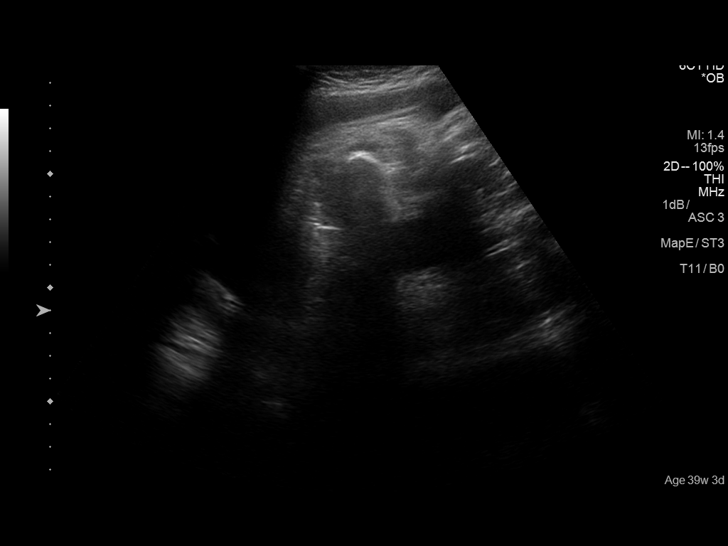
[im 15/31]
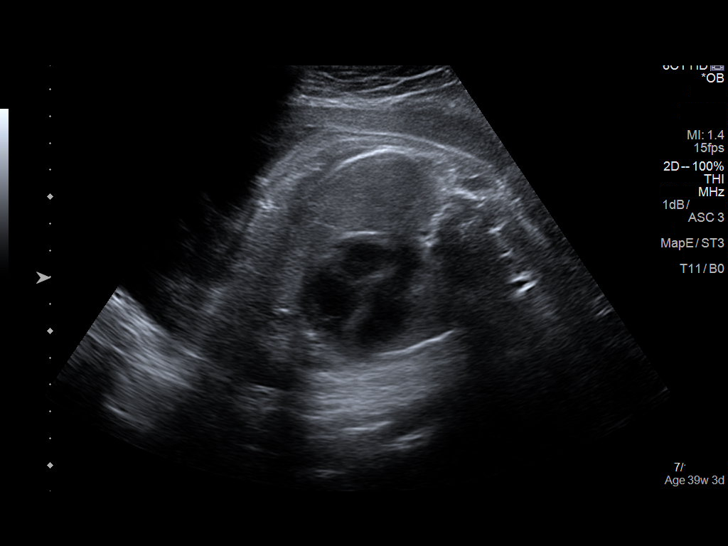
[im 17/31]
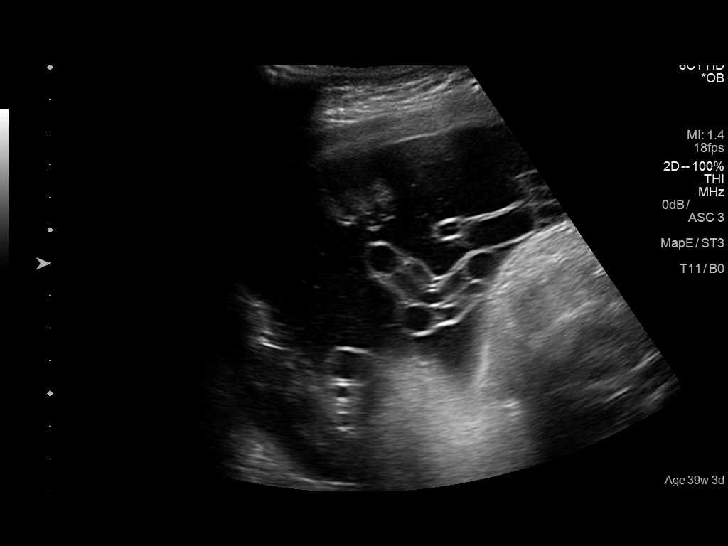
[im 19/31]
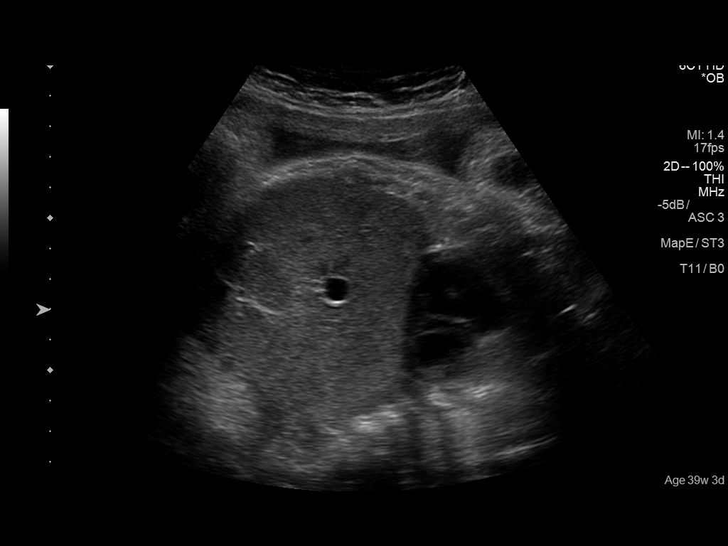
[im 22/31]
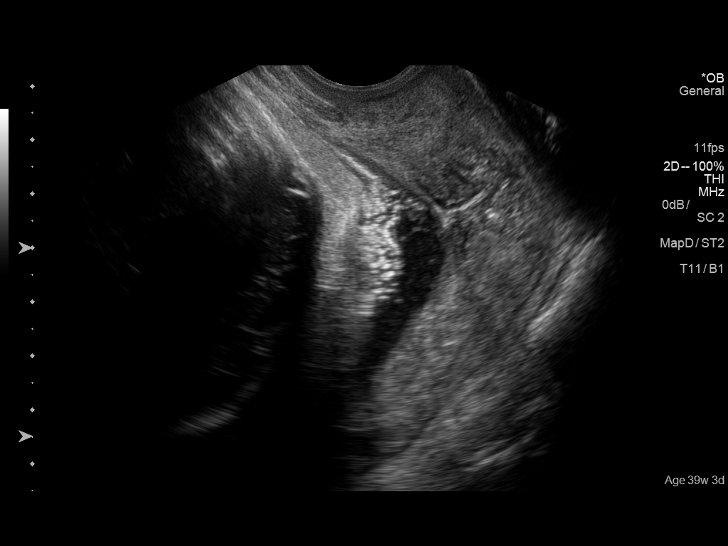
[im 24/31]
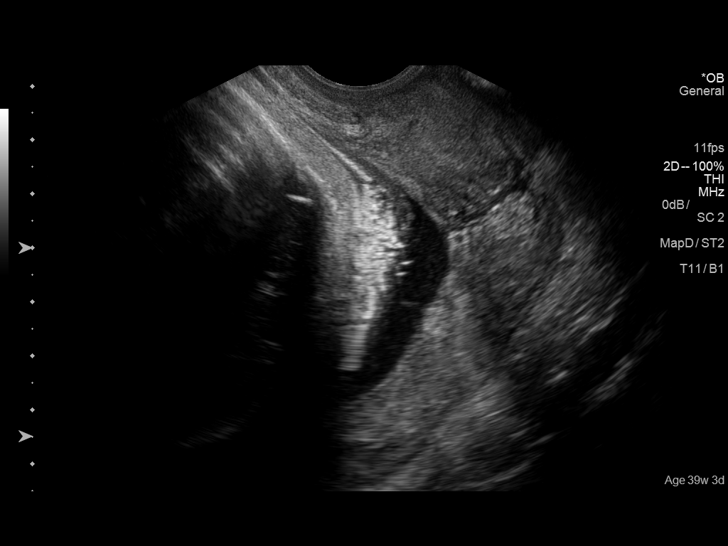
[im 26/31]
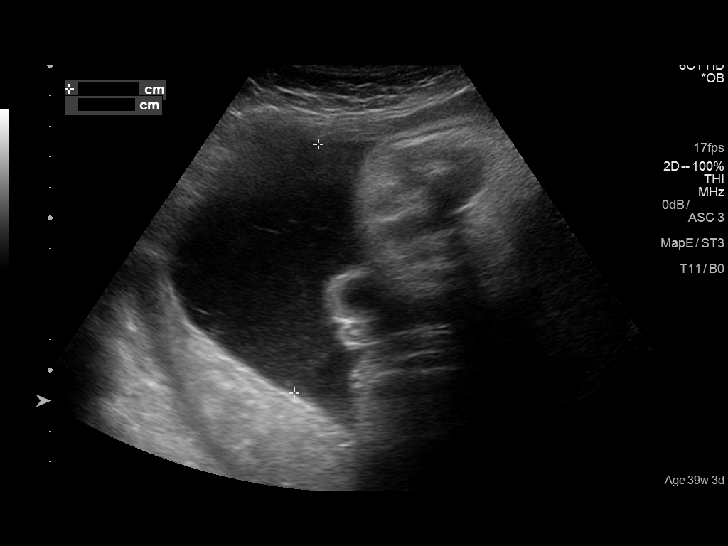
[im 28/31]
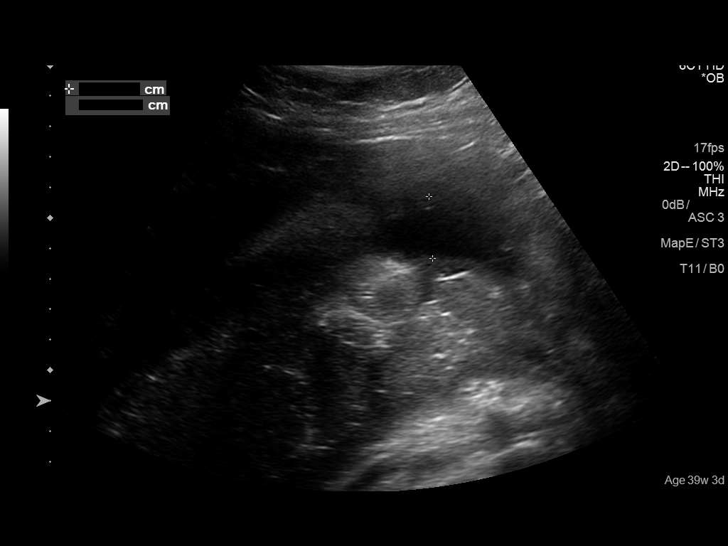
[im 31/31]
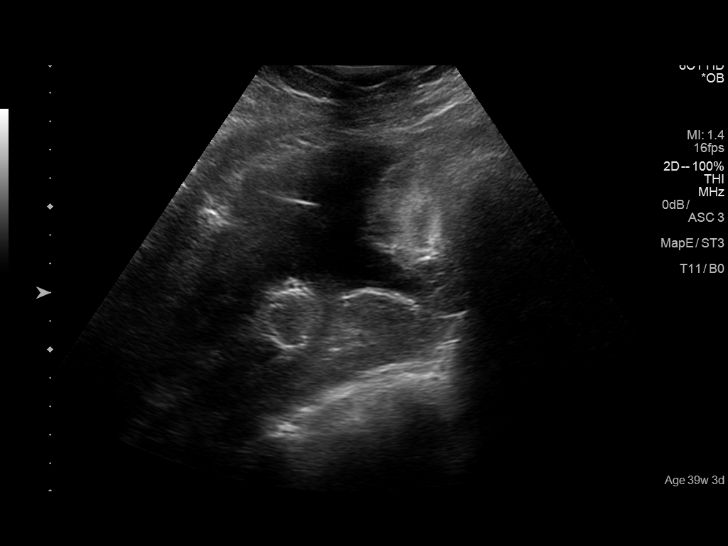

[14 of 28 positions shown; findings below may reference images not displayed]

FINDINGS: Number of Fetuses: 1

Heart Rate:  152 bpm

Movement: Yes

Presentation: Cephalic

Placental Location: Posterior

Previa: Complete

Amniotic Fluid (Subjective):  Within normal limits.

AFI:  18.7 cm

BPD:  8.26 cm 33 w 2 d

MATERNAL FINDINGS:

Cervix:  Appears closed.

Uterus/Adnexae:  No abnormality visualized.
IMPRESSION: 1. Single live intrauterine pregnancy noted, with a biparietal
diameter of 8.3 cm, corresponding to a gestational age of 33 weeks 2
days. This does not match the gestational age by LMP, and reflects a
new estimated date of delivery January 14, 2017.
2. Complete placenta previa noted. OB/GYN is aware and emergent
C-section is planned.

This exam is performed on an emergent basis and does not
comprehensively evaluate fetal size, dating, or anatomy; follow-up
complete OB US should be considered if further fetal assessment is
warranted.

## 2021-06-24 ENCOUNTER — Emergency Department
Admission: EM | Admit: 2021-06-24 | Discharge: 2021-06-24 | Disposition: A | Discharge status: Home / Self Care | Payer: Self-pay | Attending: Emergency Medicine | Admitting: Emergency Medicine

## 2021-06-24 ENCOUNTER — Other Ambulatory Visit: Payer: Self-pay

## 2021-06-24 ENCOUNTER — Emergency Department: Payer: Self-pay

## 2021-06-24 DIAGNOSIS — Z3491 Encounter for supervision of normal pregnancy, unspecified, first trimester: Secondary | ICD-10-CM

## 2021-06-24 DIAGNOSIS — N3000 Acute cystitis without hematuria: Secondary | ICD-10-CM | POA: Insufficient documentation

## 2021-06-24 DIAGNOSIS — R1030 Lower abdominal pain, unspecified: Secondary | ICD-10-CM

## 2021-06-24 DIAGNOSIS — B9689 Other specified bacterial agents as the cause of diseases classified elsewhere: Secondary | ICD-10-CM

## 2021-06-24 DIAGNOSIS — R112 Nausea with vomiting, unspecified: Secondary | ICD-10-CM

## 2021-06-24 DIAGNOSIS — A749 Chlamydial infection, unspecified: Secondary | ICD-10-CM | POA: Insufficient documentation

## 2021-06-24 DIAGNOSIS — O26899 Other specified pregnancy related conditions, unspecified trimester: Secondary | ICD-10-CM

## 2021-06-24 DIAGNOSIS — N760 Acute vaginitis: Secondary | ICD-10-CM | POA: Insufficient documentation

## 2021-06-24 DIAGNOSIS — Z3A10 10 weeks gestation of pregnancy: Secondary | ICD-10-CM | POA: Insufficient documentation

## 2021-06-24 DIAGNOSIS — O219 Vomiting of pregnancy, unspecified: Secondary | ICD-10-CM | POA: Insufficient documentation

## 2021-06-24 LAB — URINALYSIS, ROUTINE W REFLEX MICROSCOPIC
Bilirubin Urine: NEGATIVE
Glucose, UA: NEGATIVE mg/dL
Hgb urine dipstick: NEGATIVE
Ketones, ur: NEGATIVE mg/dL
Nitrite: NEGATIVE
Protein, ur: NEGATIVE mg/dL
Specific Gravity, Urine: 1.018 (ref 1.005–1.030)
pH: 7 (ref 5.0–8.0)

## 2021-06-24 LAB — COMPREHENSIVE METABOLIC PANEL
ALT: 34 U/L (ref 0–44)
AST: 22 U/L (ref 15–41)
Albumin: 3.6 g/dL (ref 3.5–5.0)
Alkaline Phosphatase: 39 U/L (ref 38–126)
Anion gap: 8 (ref 5–15)
BUN: 13 mg/dL (ref 6–20)
CO2: 23 mmol/L (ref 22–32)
Calcium: 9.1 mg/dL (ref 8.9–10.3)
Chloride: 101 mmol/L (ref 98–111)
Creatinine, Ser: 0.53 mg/dL (ref 0.44–1.00)
GFR, Estimated: >60 mL/min (ref >60)
Glucose, Bld: 100 mg/dL — ABNORMAL HIGH (ref 70–99)
Potassium: 4.1 mmol/L (ref 3.5–5.1)
Sodium: 132 mmol/L — ABNORMAL LOW (ref 135–145)
Total Bilirubin: 0.6 mg/dL (ref 0.3–1.2)
Total Protein: 6.5 g/dL (ref 6.5–8.1)

## 2021-06-24 LAB — CBC
HCT: 36.4 % (ref 36.0–46.0)
Hemoglobin: 12.4 g/dL (ref 12.0–15.0)
MCH: 28.7 pg (ref 26.0–34.0)
MCHC: 34.1 g/dL (ref 30.0–36.0)
MCV: 84.3 fL (ref 80.0–100.0)
Platelets: 225 10*3/uL (ref 150–400)
RBC: 4.32 MIL/uL (ref 3.87–5.11)
RDW: 13.7 % (ref 11.5–15.5)
WBC: 10.3 10*3/uL (ref 4.0–10.5)
nRBC: 0 % (ref 0.0–0.2)

## 2021-06-24 LAB — HEPATITIS PANEL, ACUTE
HCV Ab: NONREACTIVE
Hep A IgM: NONREACTIVE
Hep B C IgM: NONREACTIVE
Hepatitis B Surface Ag: NONREACTIVE

## 2021-06-24 LAB — HIV ANTIBODY (ROUTINE TESTING W REFLEX): HIV Screen 4th Generation wRfx: NONREACTIVE

## 2021-06-24 LAB — HCG, QUANTITATIVE, PREGNANCY: hCG, Beta Chain, Quant, S: 185729 m[IU]/mL — ABNORMAL HIGH (ref <5)

## 2021-06-24 LAB — WET PREP, GENITAL
Sperm: NONE SEEN
Trich, Wet Prep: NONE SEEN
Yeast Wet Prep HPF POC: NONE SEEN

## 2021-06-24 LAB — CHLAMYDIA/NGC RT PCR (ARMC ONLY)
Chlamydia Tr: DETECTED — AB
N gonorrhoeae: NOT DETECTED

## 2021-06-24 LAB — ABO/RH: ABO/RH(D): A POS

## 2021-06-24 LAB — POC URINE PREG, ED: Preg Test, Ur: POSITIVE — AB

## 2021-06-24 LAB — LIPASE, BLOOD: Lipase: 34 U/L (ref 11–51)

## 2021-06-24 MED ORDER — METRONIDAZOLE 500 MG PO TABS: 500.0000 mg | ORAL_TABLET | Freq: Two times a day (BID) | ORAL | 0 refills | Status: AC

## 2021-06-24 MED ORDER — METRONIDAZOLE 500 MG PO TABS
500.0000 mg | ORAL_TABLET | Freq: Once | ORAL | Status: AC
  Administered 2021-06-24: 500 mg via ORAL
  Filled 2021-06-24: qty 1

## 2021-06-24 MED ORDER — PYRIDOXINE HCL 100 MG/ML IJ SOLN
100.0000 mg | Freq: Once | INTRAMUSCULAR | Status: AC
  Administered 2021-06-24: 100 mg via INTRAVENOUS
  Filled 2021-06-24: qty 1

## 2021-06-24 MED ORDER — AZITHROMYCIN 500 MG PO TABS
1000.0000 mg | ORAL_TABLET | Freq: Once | ORAL | Status: AC
  Administered 2021-06-24: 1000 mg via ORAL
  Filled 2021-06-24: qty 2

## 2021-06-24 MED ORDER — SODIUM CHLORIDE 0.9 % IV SOLN
1.0000 g | Freq: Once | INTRAVENOUS | Status: AC
  Administered 2021-06-24: 1 g via INTRAVENOUS
  Filled 2021-06-24: qty 10

## 2021-06-24 MED ORDER — ACETAMINOPHEN 500 MG PO TABS
1000.0000 mg | ORAL_TABLET | Freq: Once | ORAL | Status: AC
  Administered 2021-06-24: 1000 mg via ORAL
  Filled 2021-06-24: qty 2

## 2021-06-24 MED ORDER — DOXYLAMINE-PYRIDOXINE 10-10 MG PO TBEC: 1.0000 | DELAYED_RELEASE_TABLET | Freq: Two times a day (BID) | ORAL | 0 refills | Status: DC | PRN

## 2021-06-24 MED ORDER — DOXYLAMINE SUCCINATE (SLEEP) 25 MG PO TABS
25.0000 mg | ORAL_TABLET | Freq: Every evening | ORAL | Status: DC | PRN
  Filled 2021-06-24 (×2): qty 1

## 2021-06-24 MED ORDER — CEPHALEXIN 500 MG PO CAPS: 500.0000 mg | ORAL_CAPSULE | Freq: Four times a day (QID) | ORAL | 0 refills | Status: AC

## 2021-06-24 NOTE — ED Triage Notes (Signed)
Pt to ED for lower abd cramping for the past month, [redacted] weeks pregnant. Also reports emesis. Denies vag bleeding 3rd pregnancy. Hx placenta previa.

## 2021-06-24 NOTE — ED Notes (Signed)
See triage note  presents with lower abd pain for about 2 months  no vaginal bleeding  states she is approx [redacted] weeks pregnant

## 2021-06-24 NOTE — ED Provider Notes (Signed)
[facility] Emergency Department Provider Note  ____________________________________________   Event Date/Time   First MD Initiated Contact with Patient 06/24/21 1527     (approximate)  I have reviewed the triage vital signs and the nursing notes.   HISTORY  Chief Complaint Abdominal Pain and Emesis   HPI Christine Delgado is a 35 y.o. female G3P1001 who presents approximately 2 to 2-1/2 months pregnant by home urine pregnancy test with patient not recalling her last LMP for assessment of some acute on chronic lower abdominal pain and worsening of some chronic nonbloody nonbilious vomiting and nausea.  Patient states that she has not seen an OB/GYN for this pregnancy yet and has been feeling fairly depressed because partner left her shortly after patient became pregnant.  She states she has no SI and has not been taking any medications for symptoms.  She denies any fevers, chest pain, cough, shortness of breath, headache, earache, sore throat, vaginal bleeding, abnormal discharge, burning with urination, back pain, physical trauma rash or other acute sick symptoms.  No other acute concerns at this time.         History reviewed. No pertinent past medical history.  Patient Active Problem List   Diagnosis Date Noted   Complete placenta previa nos or without hemorrhage, third trimester 11/28/2016    Past Surgical History:  Procedure Laterality Date   CESAREAN SECTION N/A 11/28/2016   Procedure: CESAREAN SECTION;  Surgeon: Benjaman Kindler, MD;  Location: [facility] ORS;  Service: Obstetrics;  Laterality: N/A;    Prior to Admission medications   Medication Sig Start Date End Date Taking? Authorizing Provider  cephALEXin (KEFLEX) 500 MG capsule Take 1 capsule (500 mg total) by mouth 4 (four) times daily for 5 days. 06/24/21 06/29/21 Yes Lucrezia Starch, MD  Doxylamine-Pyridoxine (DICLEGIS) 10-10 MG TBEC Take 1 tablet by mouth 2 (two) times daily as needed. 06/24/21  Yes  Lucrezia Starch, MD  metroNIDAZOLE (FLAGYL) 500 MG tablet Take 1 tablet (500 mg total) by mouth 2 (two) times daily for 7 days. 06/24/21 07/01/21 Yes Lucrezia Starch, MD  docusate sodium (COLACE) 100 MG capsule Take 1 capsule (100 mg total) by mouth 2 (two) times daily. 11/30/16   Schermerhorn, Gwen Her, MD  Prenatal Vit-Fe Fumarate-FA (PRENATAL MULTIVITAMIN) TABS tablet Take 1 tablet by mouth daily at 12 noon.    [provider]    Allergies Patient has no known allergies.  No family history on file.  Social History Social History   Tobacco Use   Smoking status: Never   Smokeless tobacco: Never  Substance Use Topics   Alcohol use: No   Drug use: No    Review of Systems  Review of Systems  Constitutional:  Negative for chills and fever.  HENT:  Negative for sore throat.   Eyes:  Negative for pain.  Respiratory:  Negative for cough and stridor.   Cardiovascular:  Negative for chest pain.  Gastrointestinal:  Positive for abdominal pain, nausea and vomiting.  Genitourinary:  Negative for dysuria.  Musculoskeletal:  Negative for myalgias.  Skin:  Negative for rash.  Neurological:  Negative for seizures, loss of consciousness and headaches.  Psychiatric/Behavioral:  Negative for suicidal ideas.   All other systems reviewed and are negative.    ____________________________________________   PHYSICAL EXAM:  VITAL SIGNS: ED Triage Vitals [06/24/21 1229]  Enc Vitals Group     BP 118/89     Pulse Rate 92     Resp 18  Temp 98.2 F (36.8 C)     Temp Source Oral     SpO2 98 %     Weight 140 lb (63.5 kg)     Height 5\' 6"  (1.676 m)     Head Circumference      Peak Flow      Pain Score 7     Pain Loc      Pain Edu?      Excl. in [place]?    Vitals:   06/24/21 1229 06/24/21 1631  BP: 118/89 120/88  Pulse: 92 88  Resp: 18 18  Temp: 98.2 F (36.8 C)   SpO2: 98% 98%   Physical Exam Vitals and nursing note reviewed.  Constitutional:      General: She is  not in acute distress.    Appearance: She is well-developed.  HENT:     Head: Normocephalic and atraumatic.     Right Ear: External ear normal.     Left Ear: External ear normal.     Nose: Nose normal.     Mouth/Throat:     Mouth: Mucous membranes are moist.  Eyes:     Conjunctiva/sclera: Conjunctivae normal.  Cardiovascular:     Rate and Rhythm: Normal rate and regular rhythm.     Heart sounds: No murmur heard. Pulmonary:     Effort: Pulmonary effort is normal. No respiratory distress.     Breath sounds: Normal breath sounds.  Abdominal:     Palpations: Abdomen is soft.     Tenderness: There is abdominal tenderness in the suprapubic area. There is no right CVA tenderness or left CVA tenderness.  Musculoskeletal:     Cervical back: Neck supple.  Skin:    General: Skin is warm and dry.     Capillary Refill: Capillary refill takes less than 2 seconds.  Neurological:     Mental Status: She is alert and oriented to person, place, and time.  Psychiatric:        Mood and Affect: Mood normal.    Very mild suprapubic discomfort.  No other discomfort and patient has not peritonitis and has no guarding.  No CVA tenderness. ____________________________________________   LABS (all labs ordered are listed, but only abnormal results are displayed)  Labs Reviewed  WET PREP, GENITAL - Abnormal; Notable for the following components:      Result Value   Clue Cells Wet Prep HPF POC PRESENT (*)    WBC, Wet Prep HPF POC MANY (*)    All other components within normal limits  CHLAMYDIA/NGC RT PCR ([facility] ONLY)           - Abnormal; Notable for the following components:   Chlamydia Tr DETECTED (*)    All other components within normal limits  COMPREHENSIVE METABOLIC PANEL - Abnormal; Notable for the following components:   Sodium 132 (*)    Glucose, Bld 100 (*)    All other components within normal limits  URINALYSIS, ROUTINE W REFLEX MICROSCOPIC - Abnormal; Notable for the following  components:   Color, Urine YELLOW (*)    APPearance TURBID (*)    Leukocytes,Ua LARGE (*)    Bacteria, UA MANY (*)    All other components within normal limits  HCG, QUANTITATIVE, PREGNANCY - Abnormal; Notable for the following components:   hCG, Beta Chain, Quant, S 185,729 (*)    All other components within normal limits  POC URINE PREG, ED - Abnormal; Notable for the following components:   Preg Test, Ur Positive (*)  All other components within normal limits  URINE CULTURE  LIPASE, BLOOD  CBC  HIV ANTIBODY (ROUTINE TESTING W REFLEX)  HEPATITIS PANEL, ACUTE  RPR  RUBELLA SCREEN  ABO/RH   ____________________________________________  EKG  ____________________________________________  RADIOLOGY  ED MD interpretation: Pelvic ultrasound shows a single live IUP at approximately 10 weeks 1 day by sizing.  Heart rate is 164.  No subchronic hemorrhages visualized.  There is no intraperitoneal free fluid.  There are no adnexal masses noted.  No cyst noted.  No other acute processes noted.  Official radiology report(s): US OB Comp Less 14 Wks  Result Date: 06/24/2021 CLINICAL DATA:  Pelvic pain with positive pregnancy test. EXAM: OBSTETRIC <14 WK ULTRASOUND TECHNIQUE: Transabdominal ultrasound was performed for evaluation of the gestation as well as the maternal uterus and adnexal regions. COMPARISON:  None. FINDINGS: Intrauterine gestational sac: Single Yolk sac:  Visualized Embryo:  Visualized Cardiac Activity: Visualize Heart Rate: 164 bpm CRL:   32.2 mm   10 w 1 d                  [place] EDC: 01/19/2022 Subchorionic hemorrhage:  None visualized. Maternal uterus/adnexae: No adnexal mass. Corpus luteum cyst noted left ovary. No intraperitoneal free fluid. IMPRESSION: Single living intrauterine gestation at estimated 10 week 1 day gestational age by crown-rump length. Electronically Signed   By: Misty Stanley M.D.   On: 06/24/2021 14:26     ____________________________________________   PROCEDURES  Procedure(s) performed (including Critical Care):  Procedures   ____________________________________________   INITIAL IMPRESSION / ASSESSMENT AND PLAN / ED COURSE      Patient presents with above-stated history exam for assessment of some acute on subacute to chronic nonbloody nonbilious vomiting and some lower abdominal discomfort.  Patient states she think she is about 2 months pregnant be subjective on pregnancy test was not sure when her last LMP was.  States she is feeling quite sad because her partner left her and she has not yet had a chance to follow-up with OB.  She is denying any physical trauma, abnormal vaginal bleeding, contractions, discharge or back pain or fevers.  Differential clues hyperemesis related to her pregnancy, cystitis, possible STI, subchorionic hemorrhage and ectopic.  Pelvic ultrasound shows a single live IUP at approximately 10 weeks 1 day by sizing.  Heart rate is 164.  No subchronic hemorrhages visualized.  There is no intraperitoneal free fluid.  There are no adnexal masses noted.  No cyst noted.  No other acute processes noted.  CMP shows sodium of 132 without any other significant electrolyte or metabolic derangements.  There is no evidence of hepatitis or cholestasis.  Lipase is not consistent with acute pancreatitis.  CBC shows no leukocytosis or acute anemia given absence of focal tenderness in the right lower quadrant leukocytosis or fever with a lower suspicion for appendicitis or diverticulitis.  UA is concerning for cystitis with large leukocyte esterase and many bacteria noted.  Urine culture sent.  hCG is 185,729.  GC studies are positive for chlamydia but negative for gonorrhea.  Wet prep is positive for clue cells and some WBCs but no trichomoniasis or yeast is seen.   Patient states she is feeling much better after Diclegis and is able tolerate p.o.  At this time I do not believe  she is septic and she has no evidence of ovarian abscess on ultrasound.  We will treat for cervicitis and a UTI with a dose of azithromycin for chlamydia and course of  Flagyl for her BV.  She has received Rocephin already for UTI will cover a course of Keflex outpatient for this.  Sized importance of close outpatient OB follow-up and that they will follow-up with her with her remaining lab work including HIV, hepatitis, RPR and rubella screen.  She states she understands this.  We will send a prescription for likely just.  Given stable vitals with eyes reassuring exam work-up I think she is stable for discharge and continued outpatient evaluation.  Discharged stable condition.  Return precautions advised and discussed.        ____________________________________________   FINAL CLINICAL IMPRESSION(S) / ED DIAGNOSES  Final diagnoses:  Pelvic pain affecting pregnancy  Nausea and vomiting, unspecified vomiting type  Lower abdominal pain  First trimester pregnancy  Acute cystitis without hematuria  Chlamydia  Bacterial vaginosis    Medications  doxylamine (Sleep) (UNISOM) tablet 25 mg (has no administration in time range)  azithromycin (ZITHROMAX) tablet 1,000 mg (has no administration in time range)  metroNIDAZOLE (FLAGYL) tablet 500 mg (has no administration in time range)  cefTRIAXone (ROCEPHIN) 1 g in sodium chloride 0.9 % 100 mL IVPB (1 g Intravenous New Bag/Given 06/24/21 1702)  pyridOXINE (B-6) injection 100 mg (100 mg Intravenous Given 06/24/21 1701)  acetaminophen (TYLENOL) tablet 1,000 mg (1,000 mg Oral Given 06/24/21 1619)     ED Discharge Orders          Ordered    cephALEXin (KEFLEX) 500 MG capsule  4 times daily        06/24/21 1640    Doxylamine-Pyridoxine (DICLEGIS) 10-10 MG TBEC  2 times daily PRN        06/24/21 1641    metroNIDAZOLE (FLAGYL) 500 MG tablet  2 times daily        06/24/21 1811             Note:  This document was prepared using Dragon voice  recognition software and may include unintentional dictation errors.    Gilles Chiquito, MD 06/24/21 1816

## 2021-06-25 LAB — URINE CULTURE

## 2021-06-25 LAB — RPR: RPR Ser Ql: NONREACTIVE

## 2021-06-26 LAB — RUBELLA SCREEN: Rubella: 5.15 index (ref >0.99)

## 2021-07-22 LAB — OB RESULTS CONSOLE RUBELLA ANTIBODY, IGM: Rubella: IMMUNE

## 2021-07-22 LAB — OB RESULTS CONSOLE VARICELLA ZOSTER ANTIBODY, IGG: Varicella: IMMUNE

## 2021-07-22 LAB — OB RESULTS CONSOLE GC/CHLAMYDIA
Chlamydia: NEGATIVE
Neisseria Gonorrhea: NEGATIVE

## 2021-08-08 ENCOUNTER — Other Ambulatory Visit: Payer: Self-pay | Admitting: Student

## 2021-08-08 DIAGNOSIS — Z3482 Encounter for supervision of other normal pregnancy, second trimester: Secondary | ICD-10-CM

## 2021-09-12 ENCOUNTER — Ambulatory Visit
Admission: RE | Admit: 2021-09-12 | Discharge: 2021-09-12 | Disposition: A | Discharge status: Home / Self Care | Payer: Self-pay | Source: Ambulatory Visit | Attending: Student | Admitting: Student

## 2021-09-12 DIAGNOSIS — Z363 Encounter for antenatal screening for malformations: Secondary | ICD-10-CM | POA: Insufficient documentation

## 2021-09-12 DIAGNOSIS — Z3482 Encounter for supervision of other normal pregnancy, second trimester: Secondary | ICD-10-CM

## 2021-09-12 DIAGNOSIS — Z3A21 21 weeks gestation of pregnancy: Secondary | ICD-10-CM | POA: Insufficient documentation

## 2022-01-16 ENCOUNTER — Encounter
Admission: RE | Admit: 2022-01-16 | Discharge: 2022-01-16 | Disposition: A | Discharge status: Home / Self Care | Payer: Self-pay | Source: Ambulatory Visit | Attending: Obstetrics and Gynecology | Admitting: Obstetrics and Gynecology

## 2022-01-16 HISTORY — DX: Complete placenta previa nos or without hemorrhage, third trimester: O44.03

## 2022-01-16 HISTORY — DX: Other specified health status: Z78.9

## 2022-01-16 NOTE — Patient Instructions (Addendum)
Your procedure is scheduled on:01-20-22 Monday Report to the Registration Desk on the 1st floor of the [facility].Then proceed to Labor and Delivery (3rd floor)-Arrive at 7:30 AM  REMEMBER: Instructions that are not followed completely may result in serious medical risk, up to and including death; or upon the discretion of your surgeon and anesthesiologist your surgery may need to be rescheduled.  Do not eat food OR drink any liquids after midnight the night before surgery.  No gum chewing, lozengers or hard candies.  DO NOT take any medication the day of surgery  One week prior to surgery: Stop Anti-inflammatories (NSAIDS) such as Advil, Aleve, Ibuprofen, Motrin, Naproxen, Naprosyn and Aspirin based products such as Excedrin, Goodys Powder, BC Powder.You may however, take Tylenol if needed for pain up until the day of surgery  No Alcohol for 24 hours before or after surgery.  No Smoking including e-cigarettes for 24 hours prior to surgery.  No chewable tobacco products for at least 6 hours prior to surgery.  No nicotine patches on the day of surgery.  Do not use any "recreational" drugs for at least a week prior to your surgery.  Please be advised that the combination of cocaine and anesthesia may have negative outcomes, up to and including death. If you test positive for cocaine, your surgery will be cancelled.  On the morning of surgery brush your teeth with toothpaste and water, you may rinse your mouth with mouthwash if you wish. Do not swallow any toothpaste or mouthwash.  Use CHG Soap as directed on instruction sheet (Avoid nipple and private area)  Do not wear jewelry, make-up, hairpins, clips or nail polish.  Do not wear lotions, powders, or perfumes.   Do not shave body from the neck down 48 hours prior to surgery just in case you cut yourself which could leave a site for infection.  Also, freshly shaved skin may become irritated if using the CHG soap.  Contact lenses,  hearing aids and dentures may not be worn into surgery.  Do not bring valuables to the hospital. [facility] is not responsible for any missing/lost belongings or valuables.   Notify your doctor if there is any change in your medical condition (cold, fever, infection).  Wear comfortable clothing (specific to your surgery type) to the hospital.  After surgery, you can help prevent lung complications by doing breathing exercises.  Take deep breaths and cough every 1-2 hours. Your doctor may order a device called an Incentive Spirometer to help you take deep breaths. When coughing or sneezing, hold a pillow firmly against your incision with both hands. This is called "splinting." Doing this helps protect your incision. It also decreases belly discomfort.  If you are being admitted to the hospital overnight, leave your suitcase in the car. After surgery it may be brought to your room.  If you are being discharged the day of surgery, you will not be allowed to drive home. You will need a responsible adult (18 years or older) to drive you home and stay with you that night.   If you are taking public transportation, you will need to have a responsible adult (18 years or older) with you. Please confirm with your physician that it is acceptable to use public transportation.   Please call the [facility] at [phone] if you have any questions about these instructions.  Surgery Visitation Policy:  Patients undergoing a surgery or procedure may have two family members or support persons with them  as long as the person is not COVID-19 positive or experiencing its symptoms.   Inpatient Visitation:    Visiting hours are 7 a.m. to 8 p.m. Up to four visitors are allowed at one time in a patient room, including children. The visitors may rotate out with other people during the day. One designated support person (adult) may remain overnight.

## 2022-01-17 ENCOUNTER — Encounter
Admission: RE | Admit: 2022-01-17 | Discharge: 2022-01-17 | Disposition: A | Discharge status: Home / Self Care | Payer: Self-pay | Source: Ambulatory Visit | Attending: Obstetrics and Gynecology | Admitting: Obstetrics and Gynecology

## 2022-01-17 DIAGNOSIS — O4403 Placenta previa specified as without hemorrhage, third trimester: Secondary | ICD-10-CM | POA: Insufficient documentation

## 2022-01-17 DIAGNOSIS — Z01812 Encounter for preprocedural laboratory examination: Secondary | ICD-10-CM | POA: Insufficient documentation

## 2022-01-17 DIAGNOSIS — Z3A00 Weeks of gestation of pregnancy not specified: Secondary | ICD-10-CM | POA: Insufficient documentation

## 2022-01-17 LAB — CBC
HCT: 35.5 % — ABNORMAL LOW (ref 36.0–46.0)
Hemoglobin: 11.7 g/dL — ABNORMAL LOW (ref 12.0–15.0)
MCH: 28.3 pg (ref 26.0–34.0)
MCHC: 33 g/dL (ref 30.0–36.0)
MCV: 86 fL (ref 80.0–100.0)
Platelets: 161 10*3/uL (ref 150–400)
RBC: 4.13 MIL/uL (ref 3.87–5.11)
RDW: 14.2 % (ref 11.5–15.5)
WBC: 6.5 10*3/uL (ref 4.0–10.5)
nRBC: 0 % (ref 0.0–0.2)

## 2022-01-17 LAB — BASIC METABOLIC PANEL
Anion gap: 6 (ref 5–15)
BUN: 18 mg/dL (ref 6–20)
CO2: 24 mmol/L (ref 22–32)
Calcium: 9.1 mg/dL (ref 8.9–10.3)
Chloride: 105 mmol/L (ref 98–111)
Creatinine, Ser: 0.58 mg/dL (ref 0.44–1.00)
GFR, Estimated: >60 mL/min (ref >60)
Glucose, Bld: 109 mg/dL — ABNORMAL HIGH (ref 70–99)
Potassium: 3.9 mmol/L (ref 3.5–5.1)
Sodium: 135 mmol/L (ref 135–145)

## 2022-01-17 LAB — TYPE AND SCREEN
ABO/RH(D): A POS
Antibody Screen: NEGATIVE
Extend sample reason: UNDETERMINED

## 2022-01-18 ENCOUNTER — Inpatient Hospital Stay: Payer: Medicaid Other | Admitting: Anesthesiology

## 2022-01-18 ENCOUNTER — Inpatient Hospital Stay
Admission: EM | Admit: 2022-01-18 | Discharge: 2022-01-20 | DRG: 787 | Disposition: A | Discharge status: Home / Self Care | Payer: Medicaid Other | Attending: Obstetrics and Gynecology | Admitting: Obstetrics and Gynecology

## 2022-01-18 ENCOUNTER — Other Ambulatory Visit: Payer: Self-pay

## 2022-01-18 ENCOUNTER — Encounter
Admission: EM | Disposition: A | Discharge status: Home / Self Care | Payer: Self-pay | Source: Home / Self Care | Attending: Obstetrics and Gynecology

## 2022-01-18 DIAGNOSIS — Z3A38 38 weeks gestation of pregnancy: Secondary | ICD-10-CM | POA: Diagnosis not present

## 2022-01-18 DIAGNOSIS — Z9889 Other specified postprocedural states: Secondary | ICD-10-CM

## 2022-01-18 DIAGNOSIS — O34211 Maternal care for low transverse scar from previous cesarean delivery: Secondary | ICD-10-CM | POA: Diagnosis present

## 2022-01-18 DIAGNOSIS — Z23 Encounter for immunization: Secondary | ICD-10-CM | POA: Diagnosis not present

## 2022-01-18 DIAGNOSIS — O0993 Supervision of high risk pregnancy, unspecified, third trimester: Principal | ICD-10-CM

## 2022-01-18 DIAGNOSIS — O4292 Full-term premature rupture of membranes, unspecified as to length of time between rupture and onset of labor: Principal | ICD-10-CM | POA: Diagnosis present

## 2022-01-18 DIAGNOSIS — O26893 Other specified pregnancy related conditions, third trimester: Secondary | ICD-10-CM | POA: Diagnosis present

## 2022-01-18 DIAGNOSIS — O9081 Anemia of the puerperium: Secondary | ICD-10-CM | POA: Diagnosis not present

## 2022-01-18 DIAGNOSIS — D62 Acute posthemorrhagic anemia: Secondary | ICD-10-CM | POA: Diagnosis not present

## 2022-01-18 LAB — RUPTURE OF MEMBRANE (ROM)PLUS: Rom Plus: POSITIVE

## 2022-01-18 LAB — TYPE AND SCREEN
ABO/RH(D): A POS
Antibody Screen: NEGATIVE

## 2022-01-18 LAB — CBC
HCT: 36.3 % (ref 36.0–46.0)
Hemoglobin: 11.8 g/dL — ABNORMAL LOW (ref 12.0–15.0)
MCH: 28 pg (ref 26.0–34.0)
MCHC: 32.5 g/dL (ref 30.0–36.0)
MCV: 86 fL (ref 80.0–100.0)
Platelets: 145 10*3/uL — ABNORMAL LOW (ref 150–400)
RBC: 4.22 MIL/uL (ref 3.87–5.11)
RDW: 14.3 % (ref 11.5–15.5)
WBC: 7 10*3/uL (ref 4.0–10.5)
nRBC: 0 % (ref 0.0–0.2)

## 2022-01-18 LAB — CREATININE, SERUM
Creatinine, Ser: 0.54 mg/dL (ref 0.44–1.00)
GFR, Estimated: >60 mL/min (ref >60)

## 2022-01-18 LAB — RPR: RPR Ser Ql: NONREACTIVE

## 2022-01-18 LAB — GROUP B STREP BY PCR: Group B strep by PCR: NEGATIVE

## 2022-01-18 SURGERY — Surgical Case
Anesthesia: Spinal

## 2022-01-18 MED ORDER — GABAPENTIN 300 MG PO CAPS
300.0000 mg | ORAL_CAPSULE | ORAL | Status: AC
  Administered 2022-01-18: 300 mg via ORAL
  Filled 2022-01-18: qty 1

## 2022-01-18 MED ORDER — PHENYLEPHRINE HCL-NACL 20-0.9 MG/250ML-% IV SOLN
INTRAVENOUS | Status: DC | PRN
  Administered 2022-01-18: 50 ug/min via INTRAVENOUS

## 2022-01-18 MED ORDER — DIBUCAINE (PERIANAL) 1 % EX OINT: 1.0000 "application " | TOPICAL_OINTMENT | CUTANEOUS | Status: DC | PRN

## 2022-01-18 MED ORDER — SIMETHICONE 80 MG PO CHEW
80.0000 mg | CHEWABLE_TABLET | Freq: Three times a day (TID) | ORAL | Status: DC
  Administered 2022-01-18 – 2022-01-20 (×4): 80 mg via ORAL
  Filled 2022-01-18 (×4): qty 1

## 2022-01-18 MED ORDER — BUPIVACAINE HCL (PF) 0.5 % IJ SOLN
INTRAMUSCULAR | Status: AC
  Filled 2022-01-18: qty 60

## 2022-01-18 MED ORDER — ACETAMINOPHEN 500 MG PO TABS
1000.0000 mg | ORAL_TABLET | ORAL | Status: AC
  Administered 2022-01-18: 1000 mg via ORAL
  Filled 2022-01-18: qty 2

## 2022-01-18 MED ORDER — DIPHENHYDRAMINE HCL 50 MG/ML IJ SOLN: 12.5000 mg | INTRAMUSCULAR | Status: DC | PRN

## 2022-01-18 MED ORDER — NALOXONE HCL 4 MG/10ML IJ SOLN: 1.0000 ug/kg/h | INTRAVENOUS | Status: DC | PRN

## 2022-01-18 MED ORDER — PHENYLEPHRINE HCL (PRESSORS) 10 MG/ML IV SOLN
INTRAVENOUS | Status: DC | PRN
  Administered 2022-01-18 (×2): 80 ug via INTRAVENOUS

## 2022-01-18 MED ORDER — SODIUM CHLORIDE 0.9 % IV SOLN
500.0000 mg | Freq: Once | INTRAVENOUS | Status: AC
  Administered 2022-01-18: 500 mg via INTRAVENOUS
  Filled 2022-01-18: qty 5

## 2022-01-18 MED ORDER — MENTHOL 3 MG MT LOZG: 1.0000 | LOZENGE | OROMUCOSAL | Status: DC | PRN

## 2022-01-18 MED ORDER — KETOROLAC TROMETHAMINE 30 MG/ML IJ SOLN
30.0000 mg | Freq: Four times a day (QID) | INTRAMUSCULAR | Status: AC
  Administered 2022-01-18: 30 mg via INTRAVENOUS
  Filled 2022-01-18: qty 1

## 2022-01-18 MED ORDER — ACETAMINOPHEN 500 MG PO TABS
1000.0000 mg | ORAL_TABLET | Freq: Four times a day (QID) | ORAL | Status: DC
  Administered 2022-01-18 – 2022-01-20 (×6): 1000 mg via ORAL
  Filled 2022-01-18 (×7): qty 2

## 2022-01-18 MED ORDER — DEXAMETHASONE SODIUM PHOSPHATE 10 MG/ML IJ SOLN
INTRAMUSCULAR | Status: DC | PRN
  Administered 2022-01-18: 5 mg via INTRAVENOUS

## 2022-01-18 MED ORDER — LACTATED RINGERS IV SOLN: Freq: Once | INTRAVENOUS | Status: AC

## 2022-01-18 MED ORDER — ENOXAPARIN SODIUM 40 MG/0.4ML IJ SOSY
40.0000 mg | PREFILLED_SYRINGE | INTRAMUSCULAR | Status: DC
  Administered 2022-01-19: 40 mg via SUBCUTANEOUS
  Filled 2022-01-18 (×2): qty 0.4

## 2022-01-18 MED ORDER — SENNOSIDES-DOCUSATE SODIUM 8.6-50 MG PO TABS
2.0000 | ORAL_TABLET | Freq: Every day | ORAL | Status: DC
Start: 2022-01-19 — End: 2022-01-20
  Administered 2022-01-19 – 2022-01-20 (×2): 2 via ORAL
  Filled 2022-01-18 (×2): qty 2

## 2022-01-18 MED ORDER — MORPHINE SULFATE (PF) 2 MG/ML IV SOLN: 1.0000 mg | INTRAVENOUS | Status: DC | PRN

## 2022-01-18 MED ORDER — MORPHINE SULFATE (PF) 0.5 MG/ML IJ SOLN
INTRAMUSCULAR | Status: DC | PRN
  Administered 2022-01-18: .15 mg via EPIDURAL

## 2022-01-18 MED ORDER — PHENYLEPHRINE HCL (PRESSORS) 10 MG/ML IV SOLN
INTRAVENOUS | Status: AC
  Filled 2022-01-18: qty 1

## 2022-01-18 MED ORDER — TETANUS-DIPHTH-ACELL PERTUSSIS 5-2.5-18.5 LF-MCG/0.5 IM SUSY
0.5000 mL | PREFILLED_SYRINGE | Freq: Once | INTRAMUSCULAR | Status: AC
  Administered 2022-01-20: 0.5 mL via INTRAMUSCULAR
  Filled 2022-01-18: qty 0.5

## 2022-01-18 MED ORDER — OXYTOCIN-SODIUM CHLORIDE 30-0.9 UT/500ML-% IV SOLN
INTRAVENOUS | Status: AC
  Administered 2022-01-18: 2.5 [IU]/h via INTRAVENOUS
  Filled 2022-01-18: qty 500

## 2022-01-18 MED ORDER — IBUPROFEN 600 MG PO TABS
600.0000 mg | ORAL_TABLET | Freq: Four times a day (QID) | ORAL | Status: DC
  Administered 2022-01-19 – 2022-01-20 (×5): 600 mg via ORAL
  Filled 2022-01-18 (×5): qty 1

## 2022-01-18 MED ORDER — MEPERIDINE HCL 25 MG/ML IJ SOLN: 6.2500 mg | INTRAMUSCULAR | Status: DC | PRN

## 2022-01-18 MED ORDER — LACTATED RINGERS IV SOLN: INTRAVENOUS | Status: DC

## 2022-01-18 MED ORDER — ZOLPIDEM TARTRATE 5 MG PO TABS: 5.0000 mg | ORAL_TABLET | Freq: Every evening | ORAL | Status: DC | PRN

## 2022-01-18 MED ORDER — NALOXONE HCL 0.4 MG/ML IJ SOLN: 0.4000 mg | INTRAMUSCULAR | Status: DC | PRN

## 2022-01-18 MED ORDER — ONDANSETRON HCL 4 MG/2ML IJ SOLN
INTRAMUSCULAR | Status: DC | PRN
  Administered 2022-01-18: 4 mg via INTRAVENOUS

## 2022-01-18 MED ORDER — FENTANYL CITRATE (PF) 100 MCG/2ML IJ SOLN
INTRAMUSCULAR | Status: DC | PRN
Start: 2022-01-18 — End: 2022-01-18
  Administered 2022-01-18: 10 ug via INTRAVENOUS

## 2022-01-18 MED ORDER — PRENATAL MULTIVITAMIN CH
1.0000 | ORAL_TABLET | Freq: Every day | ORAL | Status: DC
  Administered 2022-01-18 – 2022-01-20 (×3): 1 via ORAL
  Filled 2022-01-18 (×3): qty 1

## 2022-01-18 MED ORDER — WITCH HAZEL-GLYCERIN EX PADS: 1.0000 "application " | MEDICATED_PAD | CUTANEOUS | Status: DC | PRN

## 2022-01-18 MED ORDER — HEMOSTATIC AGENTS (NO CHARGE) OPTIME
TOPICAL | Status: DC | PRN
  Administered 2022-01-18: 1 via TOPICAL

## 2022-01-18 MED ORDER — DIPHENHYDRAMINE HCL 25 MG PO CAPS: 25.0000 mg | ORAL_CAPSULE | Freq: Four times a day (QID) | ORAL | Status: DC | PRN

## 2022-01-18 MED ORDER — KETOROLAC TROMETHAMINE 30 MG/ML IJ SOLN
INTRAMUSCULAR | Status: DC | PRN
  Administered 2022-01-18: 30 mg via INTRAVENOUS

## 2022-01-18 MED ORDER — FENTANYL CITRATE (PF) 100 MCG/2ML IJ SOLN
INTRAMUSCULAR | Status: AC
  Filled 2022-01-18: qty 2

## 2022-01-18 MED ORDER — ACETAMINOPHEN 500 MG PO TABS
1000.0000 mg | ORAL_TABLET | Freq: Four times a day (QID) | ORAL | Status: DC
Start: 2022-01-18 — End: 2022-01-18

## 2022-01-18 MED ORDER — BUPIVACAINE HCL (PF) 0.5 % IJ SOLN
INTRAMUSCULAR | Status: DC | PRN
  Administered 2022-01-18: 60 mL

## 2022-01-18 MED ORDER — CEFAZOLIN SODIUM-DEXTROSE 2-4 GM/100ML-% IV SOLN
2.0000 g | INTRAVENOUS | Status: AC
  Administered 2022-01-18: 2 g via INTRAVENOUS
  Filled 2022-01-18: qty 100

## 2022-01-18 MED ORDER — COCONUT OIL OIL
1.0000 "application " | TOPICAL_OIL | Status: DC | PRN
  Filled 2022-01-18: qty 120

## 2022-01-18 MED ORDER — GABAPENTIN 300 MG PO CAPS
300.0000 mg | ORAL_CAPSULE | Freq: Every day | ORAL | Status: DC
  Administered 2022-01-18 – 2022-01-19 (×2): 300 mg via ORAL
  Filled 2022-01-18 (×3): qty 1

## 2022-01-18 MED ORDER — OXYTOCIN-SODIUM CHLORIDE 30-0.9 UT/500ML-% IV SOLN
INTRAVENOUS | Status: DC | PRN
  Administered 2022-01-18: 300 mL via INTRAVENOUS

## 2022-01-18 MED ORDER — SODIUM CHLORIDE 0.9% FLUSH: 3.0000 mL | INTRAVENOUS | Status: DC | PRN

## 2022-01-18 MED ORDER — SODIUM CHLORIDE 0.9% FLUSH
INTRAVENOUS | Status: DC | PRN
  Administered 2022-01-18: 20 mL

## 2022-01-18 MED ORDER — POVIDONE-IODINE 10 % EX SWAB
2.0000 "application " | Freq: Once | CUTANEOUS | Status: AC
  Administered 2022-01-18: 2 via TOPICAL

## 2022-01-18 MED ORDER — OXYCODONE HCL 5 MG PO TABS
5.0000 mg | ORAL_TABLET | ORAL | Status: DC | PRN
  Administered 2022-01-19 – 2022-01-20 (×2): 5 mg via ORAL
  Filled 2022-01-18 (×2): qty 1

## 2022-01-18 MED ORDER — SIMETHICONE 80 MG PO CHEW: 80.0000 mg | CHEWABLE_TABLET | ORAL | Status: DC | PRN

## 2022-01-18 MED ORDER — DEXAMETHASONE SODIUM PHOSPHATE 10 MG/ML IJ SOLN
INTRAMUSCULAR | Status: AC
  Filled 2022-01-18: qty 1

## 2022-01-18 MED ORDER — DIPHENHYDRAMINE HCL 25 MG PO CAPS: 25.0000 mg | ORAL_CAPSULE | ORAL | Status: DC | PRN

## 2022-01-18 MED ORDER — SODIUM CHLORIDE (PF) 0.9 % IJ SOLN
INTRAMUSCULAR | Status: AC
  Filled 2022-01-18: qty 50

## 2022-01-18 MED ORDER — ONDANSETRON HCL 4 MG/2ML IJ SOLN: 4.0000 mg | Freq: Three times a day (TID) | INTRAMUSCULAR | Status: DC | PRN

## 2022-01-18 MED ORDER — PROPOFOL 10 MG/ML IV BOLUS
INTRAVENOUS | Status: AC
  Filled 2022-01-18: qty 20

## 2022-01-18 MED ORDER — MORPHINE SULFATE (PF) 0.5 MG/ML IJ SOLN
INTRAMUSCULAR | Status: AC
  Filled 2022-01-18: qty 10

## 2022-01-18 MED ORDER — ONDANSETRON HCL 4 MG/2ML IJ SOLN
INTRAMUSCULAR | Status: AC
  Filled 2022-01-18: qty 2

## 2022-01-18 MED ORDER — SOD CITRATE-CITRIC ACID 500-334 MG/5ML PO SOLN
ORAL | Status: AC
  Administered 2022-01-18: 30 mL via ORAL
  Filled 2022-01-18: qty 15

## 2022-01-18 MED ORDER — ACETAMINOPHEN 500 MG PO TABS: 1000.0000 mg | ORAL_TABLET | Freq: Four times a day (QID) | ORAL | Status: DC

## 2022-01-18 MED ORDER — OXYTOCIN-SODIUM CHLORIDE 30-0.9 UT/500ML-% IV SOLN: 2.5000 [IU]/h | INTRAVENOUS | Status: AC

## 2022-01-18 MED ORDER — FAMOTIDINE 20 MG PO TABS
20.0000 mg | ORAL_TABLET | Freq: Once | ORAL | Status: AC
  Administered 2022-01-18: 20 mg via ORAL
  Filled 2022-01-18: qty 1

## 2022-01-18 MED ORDER — SOD CITRATE-CITRIC ACID 500-334 MG/5ML PO SOLN: 30.0000 mL | ORAL | Status: AC

## 2022-01-18 MED ORDER — BUPIVACAINE IN DEXTROSE 0.75-8.25 % IT SOLN
INTRATHECAL | Status: DC | PRN
  Administered 2022-01-18: 1.8 mL via INTRATHECAL

## 2022-01-18 SURGICAL SUPPLY — 35 items
BACTOSHIELD CHG 4% 4OZ (MISCELLANEOUS) ×1
BARRIER ADHS 3X4 INTERCEED (GAUZE/BANDAGES/DRESSINGS) ×1 IMPLANT
CATH KIT ON-Q SILVERSOAK 5 (CATHETERS) ×2 IMPLANT
CATH KIT ON-Q SILVERSOAK 5IN (CATHETERS) ×4 IMPLANT
DERMABOND ADVANCED (GAUZE/BANDAGES/DRESSINGS) ×1
DERMABOND ADVANCED .7 DNX12 (GAUZE/BANDAGES/DRESSINGS) ×1 IMPLANT
DRSG OPSITE POSTOP 4X10 (GAUZE/BANDAGES/DRESSINGS) ×2 IMPLANT
DRSG TELFA 3X8 NADH (GAUZE/BANDAGES/DRESSINGS) ×2 IMPLANT
ELECT CAUTERY BLADE 6.4 (BLADE) ×2 IMPLANT
ELECT REM PT RETURN 9FT ADLT (ELECTROSURGICAL) ×2
ELECTRODE REM PT RTRN 9FT ADLT (ELECTROSURGICAL) ×1 IMPLANT
GAUZE SPONGE 4X4 12PLY STRL (GAUZE/BANDAGES/DRESSINGS) ×2 IMPLANT
GLOVE BIO SURGEON STRL SZ7 (GLOVE) ×2 IMPLANT
GLOVE SURG UNDER LTX SZ7.5 (GLOVE) ×2 IMPLANT
GOWN STRL REUS W/ TWL LRG LVL3 (GOWN DISPOSABLE) ×3 IMPLANT
GOWN STRL REUS W/TWL LRG LVL3 (GOWN DISPOSABLE) ×3
MANIFOLD NEPTUNE II (INSTRUMENTS) ×2 IMPLANT
MAT PREVALON FULL STRYKER (MISCELLANEOUS) ×2 IMPLANT
NS IRRIG 1000ML POUR BTL (IV SOLUTION) ×2 IMPLANT
PACK C SECTION AR (MISCELLANEOUS) ×2 IMPLANT
PAD DRESSING TELFA 3X8 NADH (GAUZE/BANDAGES/DRESSINGS) ×1 IMPLANT
PAD OB MATERNITY 4.3X12.25 (PERSONAL CARE ITEMS) ×4 IMPLANT
PAD PREP 24X41 OB/GYN DISP (PERSONAL CARE ITEMS) ×2 IMPLANT
SCRUB CHG 4% DYNA-HEX 4OZ (MISCELLANEOUS) ×1 IMPLANT
STRIP CLOSURE SKIN 1/2X4 (GAUZE/BANDAGES/DRESSINGS) ×2 IMPLANT
SUCT VACUUM KIWI BELL (SUCTIONS) ×1 IMPLANT
SUT MNCRL 4-0 (SUTURE) ×1
SUT MNCRL 4-0 27XMFL (SUTURE) ×1
SUT PDS AB 1 TP1 96 (SUTURE) ×2 IMPLANT
SUT PLAIN GUT 0 (SUTURE) IMPLANT
SUT VIC AB 0 CTX 36 (SUTURE) ×2
SUT VIC AB 0 CTX36XBRD ANBCTRL (SUTURE) ×2 IMPLANT
SUTURE MNCRL 4-0 27XMF (SUTURE) ×1 IMPLANT
SWABSTK COMLB BENZOIN TINCTURE (MISCELLANEOUS) ×2 IMPLANT
WATER STERILE IRR 500ML POUR (IV SOLUTION) ×2 IMPLANT

## 2022-01-18 NOTE — Progress Notes (Signed)
Patient ID: Christine Delgado, female   DOB: 05-05-1986, 36 y.o.   MRN: [id number] Pt is a scheduled c/s for Monday that presented with ROM and . She confirms her desire for repeat cesarean. Declines sterilization. I have explained the risks of surgery including organ injury, blood transfusion and ,infection , and DVT. All questions answered . Proceed .

## 2022-01-18 NOTE — Anesthesia Procedure Notes (Addendum)
Spinal  Patient location during procedure: OR Start time: 01/18/2022 9:54 AM End time: 01/18/2022 9:57 AM Reason for block: surgical anesthesia Staffing Performed: anesthesiologist  Anesthesiologist: Milinda Pointer, MD Preanesthetic Checklist Completed: patient identified, IV checked, site marked, risks and benefits discussed, surgical consent, monitors and equipment checked, pre-op evaluation and timeout performed Spinal Block Patient position: sitting Prep: ChloraPrep Patient monitoring: heart rate, continuous pulse ox, blood pressure and cardiac monitor Approach: midline Location: L3-4 Injection technique: single-shot Needle Needle type: Whitacre and Introducer  Needle gauge: 24 G Needle length: 9 cm Assessment Sensory level: T10 Events: CSF return Additional Notes Sterile aseptic technique used throughout the procedure.  Negative paresthesia. Negative blood return. Positive free-flowing CSF. Expiration date of kit checked and confirmed. Patient tolerated procedure well, without complications.

## 2022-01-18 NOTE — OB Triage Note (Signed)
Pt arrived to unit with complaints of ruptured membranes that happened around 0300. Pt denies contraction pain. Pt is G3P2 [redacted]w[redacted]d. Pt reports she is a previous csx1. Pt placed on EFM monitor and TOco to non tender area. Pt checked and noted to be closed/thick/-3 glove noted some vaginal fluid on SVE. Patient reports that she is no longer with husband as he has a pregnant girlfriend. PAtient does not yet have a car seat, plan for feeding baby, applied for medical insurance/medicaid/WIC, baby name, or know the sex or have a name for the baby. Pt would benefit from Transitional care. Will notify provider of arrival

## 2022-01-18 NOTE — Op Note (Signed)
Christine Delgado, Christine Delgado MEDICAL RECORD NO: [id number] ACCOUNT NO: [id number] DATE OF BIRTH: 02-Oct-1985 FACILITY: [facility] LOCATION: [facility] PHYSICIAN: Suzy Bouchard, MD  Operative Report   DATE OF PROCEDURE: 01/18/2022  PREOPERATIVE DIAGNOSES: 1.  38+6 weeks estimated gestational age. 2.  Spontaneous rupture of membranes. 3.  Prior cesarean section elects for repeat.  PREOPERATIVE DIAGNOSES:   1.  38+6 weeks estimated gestational age. 2.  Spontaneous rupture of membranes. 3.  Prior cesarean section elects for repeat. 4.  Vigorous female, delivered.  PROCEDURE:  Elective repeat low transverse cesarean section.  SURGEON:  Suzy Bouchard, MD  ASSISTANT:  Kathrin Ruddy, certified nurse midwife.  ANESTHESIA:  Spinal.  INDICATION:  This is a 36 year old gravida 3, para 2, patient with a scheduled cesarean section scheduled for 01/20/2022 when she underwent spontaneous rupture of membranes and presented to labor and delivery.  The patient confirms her desire to have a  repeat low transverse cesarean section.  The patient declines sterilization.  DESCRIPTION OF PROCEDURE:  After adequate spinal anesthesia, the patient was placed in dorsal supine position with a hip roll on the right side.  The patient's abdomen was prepped and draped in normal sterile fashion.  Timeout was performed.  The patient  did receive 2 grams of IV Ancef and 500 mg azithromycin prior to commencement of the procedure for surgical prophylaxis.  A Pfannenstiel incision was made 2 fingerbreadths above the symphysis pubis.  Sharp dissection was used to identify the fascia.   Fascia was opened in the midline and opened in a transverse fashion.  The superior aspect of the fascia was grasped with Kocher clamps.  Recti muscles were dissected free.  There was a slight avulsion of the right rectus muscle with dissection with  lateral traction.  Peritoneum was opened sharply and the vesicouterine peritoneal fold was  identified.  Bladder flap was created and bladder was reflected inferiorly.  Low transverse uterine incision was made.  Upon entry into the endometrial cavity,  clear fluid resulted.  Fetal head was brought to the incision and a Kiwi vacuum was applied to the occiput and with one gentle pull, the head was delivered and the vacuum was removed.  Shoulders and body were delivered without difficulty.  Vigorous female  was then dried on the mother's abdomen for 60 seconds and cord was then doubly clamped and infant was passed to the nursery staff who assigned Apgar scores of 8 and 9, fetal weight 3470 grams.  Time of birth 10:23.  The placenta was manually delivered  and the uterus was exteriorized and the endometrial cavity was wiped clean with laparotomy tape.  Uterine incision was then closed with #1 chromic suture in a running locking fashion, good approximation of edges.  Good hemostasis noted.  Fallopian tubes  and ovaries appeared normal.  The posterior cul-de-sac was irrigated and suctioned and the uterus was placed back into the abdominal cavity.  The pericolic gutters were then wiped clean with laparotomy tape.  Uterine incision again appeared hemostatic  and Interceed was placed over the uterine incision in a T-shaped fashion.  Fascia was then closed with 0 Vicryl suture in a running nonlocking fashion.  Good approximation of edges.  The fascial edges were then injected with a solution of 60 mL Marcaine  plus 20 mL normal saline.  40 mL of solution was injected, subcutaneous tissues were irrigated and inferior skin edge was undermined given some previous scarring, so as to allow for more cosmetic closure of the incision.  Good hemostasis was noted and  the skin was reapproximated with Insorb absorbable staples with good cosmetic effect.  Additional 30 mL of Marcaine solution was injected beneath the skin.  There were no complications.  QUANTITATIVE BLOOD LOSS:  540 mL  INTRAOPERATIVE FLUIDS:  500  mL  URINE OUTPUT:  20 mL and the patient received 30 mg of intravenous Toradol at the end of the case.  She was taken to recovery room in good condition.   PUS D: 01/18/2022 11:05:15 am T: 01/18/2022 12:00:00 pm  JOB: [id number]/ [id number]

## 2022-01-18 NOTE — Anesthesia Preprocedure Evaluation (Signed)
Anesthesia Evaluation  Patient identified by MRN, date of birth, ID band Patient awake    Reviewed: Allergy & Precautions, NPO status , Patient's Chart, lab work & pertinent test results, reviewed documented beta blocker date and time   Airway Mallampati: II  TM Distance: >3 FB     Dental no notable dental hx. (+) Chipped   Pulmonary    Pulmonary exam normal - rhonchi (-) wheezing      Cardiovascular  Rhythm:Regular Rate:Normal - Systolic murmurs and - Diastolic murmurs    Neuro/Psych    GI/Hepatic   Endo/Other    Renal/GU      Musculoskeletal   Abdominal   Peds  Hematology   Anesthesia Other Findings After discussion with Dr.Beasley and the patient, it was decided to proceed with spinal(rather than Gen'l) She feels the risk of massive bleeding is low in this patient.  Reproductive/Obstetrics                             Anesthesia Physical  Anesthesia Plan  ASA: 2  Anesthesia Plan: Spinal   Post-op Pain Management:    Induction:   PONV Risk Score and Plan: 2 and Ondansetron and Dexamethasone  Airway Management Planned: Nasal Cannula  Additional Equipment:   Intra-op Plan:   Post-operative Plan:   Informed Consent: I have reviewed the patients History and Physical, chart, labs and discussed the procedure including the risks, benefits and alternatives for the proposed anesthesia with the patient or authorized representative who has indicated his/her understanding and acceptance.       Plan Discussed with: CRNA and Anesthesiologist  Anesthesia Plan Comments:         Anesthesia Quick Evaluation  SAB with GA as BU, PIV x 1, ASA SM, RBO discussed, Consent signed

## 2022-01-18 NOTE — Anesthesia Procedure Notes (Signed)
Date/Time: 01/18/2022 10:10 AM Performed by: Doreen Salvage, CRNA Pre-anesthesia Checklist: Patient identified, Emergency Drugs available, Suction available and Patient being monitored Patient Re-evaluated:Patient Re-evaluated prior to induction Oxygen Delivery Method: Nasal cannula Induction Type: IV induction Dental Injury: Teeth and Oropharynx as per pre-operative assessment  Comments: Nasal cannula with etCO2 monitoring

## 2022-01-18 NOTE — Brief Op Note (Signed)
01/18/2022  10:48 AM  PATIENT:  Algie Coffer  36 y.o. female  PRE-OPERATIVE DIAGNOSIS:  prior cesarean Elective repeat  POST-OPERATIVE DIAGNOSIS:  prior cesarean  PROCEDURE:  Procedure(s): CESAREAN SECTION Elective repeat cesarean SURGEON:  Surgeon(s) and Role:    * Julio Storr, Ihor Austin, MD - Primary  PHYSICIAN ASSISTANT: Margaretmary Eddy , CNM   ASSISTANTS: none   ANESTHESIA:   spinal  EBL:  QBL : 3470 gm   BLOOD ADMINISTERED:none  DRAINS: Urinary Catheter (Foley)   LOCAL MEDICATIONS USED:  MARCAINE     SPECIMEN:  No Specimen  DISPOSITION OF SPECIMEN:  N/A  COUNTS:  YES  TOURNIQUET:  * No tourniquets in log *  DICTATION: .Other Dictation: Dictation Number verbal  PLAN OF CARE: Admit to inpatient   PATIENT DISPOSITION:  PACU - hemodynamically stable.   Delay start of Pharmacological VTE agent (>24hrs) due to surgical blood loss or risk of bleeding: not applicable

## 2022-01-18 NOTE — Transfer of Care (Signed)
Immediate Anesthesia Transfer of Care Note  Patient: Christine Delgado  Procedure(s) Performed: Procedure(s): CESAREAN SECTION  Patient Location: Labor and Delivery Rm 1  Anesthesia Type:Spinal  Level of Consciousness: awake, alert  and oriented  Airway & Oxygen Therapy: Patient Spontanous Breathing and Patient connected to face mask oxygen  Post-op Assessment: Report given to RN and Post -op Vital signs reviewed and stable  Post vital signs: Reviewed and stable  Last Vitals:  Vitals:   01/18/22 0806 01/18/22 1103  BP: 106/75 (!) 100/54  Pulse: 87   Resp: 18   Temp: 36.7 C 36.5 C  SpO2:  95%    Complications: No apparent anesthesia complications

## 2022-01-18 NOTE — Discharge Summary (Signed)
Obstetrical Discharge Summary  Patient Name: Christine Delgado DOB: 10-Sep-1985 MRN: [id number]  Date of Admission: 01/18/2022 Date of Delivery: 01/18/22 Delivered by: Beverly Gust MD Date of Discharge: 01/20/2022  Primary OB: [facility]  LMP:No LMP recorded. EDC Estimated Date of Delivery: 01/26/22 Gestational Age at Delivery: [redacted]w[redacted]d   Antepartum complications: none noted, no records  Admitting Diagnosis: SROM 38+6 week , elective repeat c/s  Secondary Diagnosis: Patient Active Problem List   Diagnosis Date Noted   Supervision of high risk pregnancy in third trimester 01/18/2022   Postoperative state 01/18/2022   Complete placenta previa nos or without hemorrhage, third trimester 11/28/2016    Augmentation: N/A Complications: None Intrapartum complications/course:  Date of Delivery: 01/18/22 Delivered By: Beverly Gust MD Delivery Type: repeat cesarean section, low transverse incision Anesthesia: epidural Placenta: spontaneous Laceration:  Episiotomy: none Newborn Data: Live born female  Birth Weight: 7 lb 10.4 oz (3470 g) APGAR: 8, 9  Newborn Delivery   Birth date/time: 01/18/2022 10:23:00 Delivery type: C-Section, Vacuum Assisted Trial of labor: No C-section categorization: Repeat        Postpartum Procedures: TOC consult  Edinburgh:     01/19/2022    8:08 PM  Edinburgh Postnatal Depression Scale Screening Tool  I have been able to laugh and see the funny side of things. 0  I have looked forward with enjoyment to things. 2  I have blamed myself unnecessarily when things went wrong. 0  I have been anxious or worried for no good reason. 3  I have felt scared or panicky for no good reason. 2  Things have been getting on top of me. 1  I have been so unhappy that I have had difficulty sleeping. 3  I have felt sad or miserable. 2  I have been so unhappy that I have been crying. 3  The thought of harming myself has occurred to me. 0  Edinburgh  Postnatal Depression Scale Total 16    Post partum course:  Patient had an uncomplicated postpartum course.  By time of discharge on POD#2, her pain was controlled on oral pain medications; she had appropriate lochia and was ambulating, voiding without difficulty, tolerating regular diet and passing flatus.   She was deemed stable for discharge to home.    Discharge Physical Exam:  BP 114/86 (BP Location: Left Arm)   Pulse 92   Temp 98.7 F (37.1 C) (Oral)   Resp 18   Ht 5\' 5"  (1.651 m)   Wt 79.4 kg   SpO2 100%   Breastfeeding Yes   BMI 29.12 kg/m   General: NAD CV: RRR Pulm: nl effort ABD: s/nd/nt, fundus firm and below the umbilicus Lochia: moderate Incision: c/d/i DVT Evaluation: LE non-ttp, no evidence of DVT on exam.  Hemoglobin  Date Value Ref Range Status  01/19/2022 11.0 (L) 12.0 - 15.0 g/dL Final   HCT  Date Value Ref Range Status  01/19/2022 32.7 (L) 36.0 - 46.0 % Final     Disposition: stable, discharge to home. Baby Feeding: breastmilk and formula Baby Disposition: home with mom  Rh Immune globulin given: n/a Rubella vaccine given: Immune Tdap vaccine given in AP or PP setting: Unknown Flu vaccine given in AP or PP setting: Not in season  Contraception: TBD  Prenatal Labs:   Blood type/Rh A Pos  Antibody screen neg  Rubella Immune  Varicella  immune  RPR NR  HBsAg Neg  HIV NR  GC neg  Chlamydia neg  Genetic screening negative  1 hour GTT  89  3 hour GTT    GBS Neg    Plan:  Algie Coffer was discharged to home in good condition.   Discharge Medications: Allergies as of 01/20/2022   No Known Allergies      Medication List     TAKE these medications    acetaminophen 500 MG tablet Commonly known as: TYLENOL Take 2 tablets (1,000 mg total) by mouth every 6 (six) hours.   ibuprofen 600 MG tablet Commonly known as: ADVIL Take 1 tablet (600 mg total) by mouth every 6 (six) hours.   oxyCODONE 5 MG immediate release  tablet Commonly known as: Oxy IR/ROXICODONE Take 1-2 tablets (5-10 mg total) by mouth every 4 (four) hours as needed for moderate pain.   prenatal multivitamin Tabs tablet Take 1 tablet by mouth daily at 12 noon.         Follow-up Information     Schermerhorn, Ihor Austin, MD Follow up in 2 week(s).   Specialty: Obstetrics and Gynecology Why: Post op follow up Contact information: [address] [place] [address] [phone]                 Signed: Cyril Mourning  01/20/2022 8:55 AM

## 2022-01-18 NOTE — H&P (Signed)
OB History & Physical   History of Present Illness:  Chief Complaint: water is broken  HPI:  Christine Delgado is a 36 y.o. G38P2002 female at [redacted]w[redacted]d dated by LMP and c/w 10wk [place], EDD 01/26/22.  She presents to L&D for ruptured membranes that occurred around 0230 this morning. Pt denies UCs, VB and reports Active FM   Pregnancy Issues: 1. No prenatal records, labs obtained via [facility];    Maternal Medical History:   Past Medical History:  Diagnosis Date   Complete placenta previa nos or without hemorrhage, third trimester    Medical history non-contributory     Past Surgical History:  Procedure Laterality Date   CESAREAN SECTION N/A 11/28/2016   Procedure: CESAREAN SECTION;  Surgeon: Christeen Douglas, MD;  Location: [facility] ORS;  Service: Obstetrics;  Laterality: N/A;    No Known Allergies  Prior to Admission medications   Medication Sig Start Date End Date Taking? Authorizing Provider  Prenatal Vit-Fe Fumarate-FA (PRENATAL MULTIVITAMIN) TABS tablet Take 1 tablet by mouth daily at 12 noon.    [provider]     Prenatal care site: [facility]  Social History: She  reports that she has never smoked. She has never used smokeless tobacco. She reports that she does not drink alcohol and does not use drugs.  Family History: family history is not on file.   Review of Systems: A full review of systems was performed and negative except as noted in the HPI.     Physical Exam:  Vital Signs: BP 112/81   Pulse (!) 121   Breastfeeding Yes  General: no acute distress.  HEENT: normocephalic, atraumatic Heart: regular rate & rhythm.  No murmurs/rubs/gallops Lungs: clear to auscultation bilaterally, normal respiratory effort Abdomen: soft, gravid, non-tender Pelvic: deferred, grossly ruptured   Extremities: non-tender, symmetric, no edema bilaterally.  DTRs: 2+  Neurologic: Alert & oriented x 3.    Results for orders placed or performed during the hospital  encounter of 01/18/22 (from the past 24 hour(s))  ROM Plus ([facility] only)     Status: None   Collection Time: 01/18/22  5:07 AM  Result Value Ref Range   Rom Plus POSITIVE   Type and screen [facility]     Status: None (Preliminary result)   Collection Time: 01/18/22  7:17 AM  Result Value Ref Range   ABO/RH(D) PENDING    Antibody Screen PENDING    Sample Expiration      01/21/2022,2359 Performed at [facility], [address] [place], [place] [address]     Pertinent Results:  Prenatal Labs: Blood type/Rh A Pos  Antibody screen neg  Rubella Immune  Varicella  immune  RPR NR  HBsAg Neg  HIV NR  GC neg  Chlamydia neg  Genetic screening negative  1 hour GTT  89  3 hour GTT   GBS    FHT: 130bpm, mod variability, + accel, no decels TOCO: occasional SVE:   1/50/-3, soft/posterior    Cephalic by leopolds  No results found.  Assessment:  Christine Delgado is a 36 y.o. G56P2002 female at [redacted]w[redacted]d with PROM, prior CS.   Plan:  1. Admit to Labor & Delivery; consents reviewed and obtained - notified dr Feliberto Gottron of pt arrival and PROM and desire for elective repeat CS.   2. Fetal Well being  - Fetal Tracing: cat I - Group B Streptococcus ppx indicated: unknown, GBS by PCR ordered.  - Presentation: cephalic   3. Routine OB: -  Prenatal labs reviewed, as above - Rh A Pos - CBC, T&S, RPR on admit - Clear fluids, IVF  4. Post Partum Planning: - Infant feeding: TBD - Contraception: TBD  Randa Ngo, CNM 01/18/22 7:56 AM

## 2022-01-19 LAB — CBC
HCT: 32.7 % — ABNORMAL LOW (ref 36.0–46.0)
Hemoglobin: 11 g/dL — ABNORMAL LOW (ref 12.0–15.0)
MCH: 28.9 pg (ref 26.0–34.0)
MCHC: 33.6 g/dL (ref 30.0–36.0)
MCV: 86.1 fL (ref 80.0–100.0)
Platelets: 143 10*3/uL — ABNORMAL LOW (ref 150–400)
RBC: 3.8 MIL/uL — ABNORMAL LOW (ref 3.87–5.11)
RDW: 14.2 % (ref 11.5–15.5)
WBC: 12.6 10*3/uL — ABNORMAL HIGH (ref 4.0–10.5)
nRBC: 0 % (ref 0.0–0.2)

## 2022-01-19 NOTE — Progress Notes (Signed)
Postop Day  1  Subjective: no complaints, up ad lib, voiding, and tolerating PO  Doing well, no concerns. Ambulating without difficulty, pain managed with PO meds, tolerating regular diet, and voiding without difficulty.   No fever/chills, chest pain, shortness of breath, nausea/vomiting, or leg pain. No nipple or breast pain. No headache, visual changes, or RUQ/epigastric pain.  Objective: BP 94/63 (BP Location: Left Arm)   Pulse 77   Temp 97.7 F (36.5 C)   Resp 18   Ht 5\' 5"  (1.651 m)   Wt 79.4 kg   SpO2 98%   Breastfeeding Yes   BMI 29.12 kg/m    Physical Exam:  General: alert, cooperative, and no distress Breasts: soft/nontender CV: RRR Pulm: nl effort, CTABL Abdomen: soft, non-tender, active bowel sounds Uterine Fundus: firm Incision: no significant drainage Perineum: minimal edema, intact Lochia: appropriate DVT Evaluation: No evidence of DVT seen on physical exam.  Recent Labs    01/18/22 0717 01/19/22 0529  HGB 11.8* 11.0*  HCT 36.3 32.7*  WBC 7.0 12.6*  PLT 145* 143*    Assessment/Plan: 36 y.o. G3P3003 postpartum day # 1  -Continue routine postpartum care -Lactation consult PRN for breastfeeding  -Acute blood loss anemia - hemodynamically stable and asymptomatic; start PO ferrous sulfate BID with stool softeners  -Immunization status:   all immunizations up to date -Up to shower today to remove current dressing.  Replace with OP site honey comb dressing.  Instructed to leave on for 7 days and then remove at home.    Disposition: Continue inpatient postpartum care    LOS: 1 day   31, CNM 01/19/2022, 10:06 AM   ----- 03/21/2022  Certified Nurse Midwife [facility]

## 2022-01-19 NOTE — Anesthesia Postprocedure Evaluation (Signed)
Anesthesia Post Note  Patient: Caylor Cerino  Procedure(s) Performed: CESAREAN SECTION  Patient location during evaluation: Mother Baby Anesthesia Type: Spinal Level of consciousness: awake and alert Pain management: pain level controlled Vital Signs Assessment: post-procedure vital signs reviewed and stable Respiratory status: spontaneous breathing, nonlabored ventilation and respiratory function stable Cardiovascular status: stable Postop Assessment: no headache, no backache and epidural receding Anesthetic complications: no   No notable events documented.   Last Vitals:  Vitals:   01/19/22 0334 01/19/22 0750  BP: (!) 95/58 94/63  Pulse: 82 77  Resp: 18 18  Temp: 36.6 C 36.5 C  SpO2: 97% 98%    Last Pain:  Vitals:   01/19/22 0930  TempSrc:   PainSc: 0-No pain                 Corinda Gubler

## 2022-01-19 NOTE — Anesthesia Post-op Follow-up Note (Signed)
  Anesthesia Pain Follow-up Note  Patient: Christine Delgado  Day #: 1  Date of Follow-up: 01/19/2022 Time: 10:27 AM  Last Vitals:  Vitals:   01/19/22 0334 01/19/22 0750  BP: (!) 95/58 94/63  Pulse: 82 77  Resp: 18 18  Temp: 36.6 C 36.5 C  SpO2: 97% 98%    Level of Consciousness: alert  Pain: mild   Side Effects:None  Catheter Site Exam:clean, dry, no drainage  Anti-Coag Meds (From admission, onward)   Start     Dose/Rate Route Frequency Ordered Stop   01/19/22 1100  enoxaparin (LOVENOX) injection 40 mg        40 mg Subcutaneous Every 24 hours 01/18/22 1108         Plan: D/C from anesthesia care at surgeon's request  Corinda Gubler

## 2022-01-19 NOTE — Lactation Note (Signed)
This note was copied from a baby's chart. Lactation Consultation Note  Patient Name: Christine Delgado IWLNL'[id number] Date: 01/19/2022   Age:36 hours  Attempted to see mom. Mom with visitors declined lactation consult at this time. Mom aware LC is available today if she would like breastfeeding assistance. Update provided to care nurse. LC requested care nurse to call [facility] if mom needs assistance.   Fuller Song 01/19/2022, 2:22 PM

## 2022-01-20 ENCOUNTER — Inpatient Hospital Stay: Admission: RE | Admit: 2022-01-20 | Payer: Self-pay | Source: Home / Self Care | Admitting: Obstetrics and Gynecology

## 2022-01-20 ENCOUNTER — Encounter: Payer: Self-pay | Admitting: Obstetrics and Gynecology

## 2022-01-20 DIAGNOSIS — O4403 Placenta previa specified as without hemorrhage, third trimester: Secondary | ICD-10-CM

## 2022-01-20 MED ORDER — OXYCODONE HCL 5 MG PO TABS: 5.0000 mg | ORAL_TABLET | ORAL | 0 refills | Status: AC | PRN

## 2022-01-20 MED ORDER — IBUPROFEN 600 MG PO TABS: 600.0000 mg | ORAL_TABLET | Freq: Four times a day (QID) | ORAL | 0 refills | Status: AC

## 2022-01-20 MED ORDER — OXYCODONE HCL 5 MG PO TABS: 5.0000 mg | ORAL_TABLET | ORAL | 0 refills | Status: DC | PRN

## 2022-01-20 MED ORDER — ACETAMINOPHEN 500 MG PO TABS: 1000.0000 mg | ORAL_TABLET | Freq: Four times a day (QID) | ORAL | 0 refills | Status: AC

## 2022-01-20 NOTE — Progress Notes (Signed)
Pt discharged with infant.  Discharge instructions, prescriptions and follow up appointment given to and reviewed with pt. Pt verbalized understanding. Escorted out by auxillary. 

## 2022-01-20 NOTE — Lactation Note (Signed)
This note was copied from a baby's chart. Lactation Consultation Note  Patient Name: Christine Delgado [id number] Date: 01/20/2022 Reason for consult: Follow-up assessment;Early term 37-38.6wks Age:36 hours  Maternal Data Has patient been taught Hand Expression?: Yes Does the patient have breastfeeding experience prior to this delivery?: Yes How long did the patient breastfeed?: 10yr w/ first  Mom has a BF history, but questions supply with this baby. She has been formula feeding at times.  Feeding Mother's Current Feeding Choice: Breast Milk and Formula Nipple Type: Slow - flow  Mom continues to breast and formula feed. Discussed feeding plan when returning to work: pumping and bottle feeding breastmilk with formula as needed, or BF when with baby and formula feeding when apart.  LATCH Score   Baby was receiving/finishing a formula feeding upon entry.   Lactation Tools Discussed/Used    Interventions Interventions: Breast massage;Hand express;Pre-pump if needed;Coconut oil;DEBP;Ice;Education;Pace feeding (pump use at work, encouraged not to prop bottle, BF first before supplement; milk supply and demand, normal course of lactation)  Reviewed extensively breast milk supply and demand. Encouraged to put baby to breast w/ every feeding before offering supplement. Educated on paced-bottle feeding and demonstrated baby's positioning and slowing down the feed to prevent over feeding.  Discharge Discharge Education: Engorgement and breast care;Outpatient recommendation Pump: Manual (Encouraged to reach out to [facility] when back to work for pump) WIC Program: Yes  Consult Status Consult Status: Complete  Outpatient lactation service information given.  Danford Bad 01/20/2022, 9:49 AM

## 2022-01-20 NOTE — TOC Initial Note (Signed)
Transition of Care [facility]) - Initial/Assessment Note    Patient Details  Name: Christine Delgado MRN: [id number] Date of Birth: 1986/05/02  Transition of Care [facility]) CM/SW Contact:    Anselm Pancoast, RN Phone Number: 01/20/2022, 8:23 AM  Clinical Narrative:                 Patient very emotional during assessment reporting that she recently left her husband who had reported to her that he was having an affair and his girlfriend was also pregnant. Patient lives with her mother and her 94 and 49 year old sons. Mother helps as able but works a lot of hours. Patient reports she is working through the documentation process and is scared to ask for help with [place] or Medicaid. Agreed to talk with [facility] for resources and case management after discharge. Patient reports infants husband was supposed to bring her a car seat but she has not been able to get him to respond to her. Advised patient that [facility] would provide a car seat to assist in this situation. Planning to breast and formula feed and reports she will use her credit card to pay for formula. Discussed WIC options and resources and patient agreed to talk with [facility] regarding those as well. Patient reports she has family and friends locally that assist as they can but sometimes feels lonely. Discussed PPD and resources if needed. Patient very appreciative of assistance and open to follow up post discharge in community.   TOC completed referral to [facility] OP Case Management.         Patient Goals and CMS Choice        Expected Discharge Plan and Services                                                Prior Living Arrangements/Services                       Activities of Daily Living Home Assistive Devices/Equipment: None ADL Screening (condition at time of admission) Patient's cognitive ability adequate to safely complete daily activities?: Yes Is the patient deaf or have difficulty hearing?: No Does the patient have difficulty  seeing, even when wearing glasses/contacts?: No Does the patient have difficulty concentrating, remembering, or making decisions?: No Patient able to express need for assistance with ADLs?: Yes Does the patient have difficulty dressing or bathing?: No Independently performs ADLs?: Yes (appropriate for developmental age) Does the patient have difficulty walking or climbing stairs?: No Weakness of Legs: None Weakness of Arms/Hands: None  Permission Sought/Granted                  Emotional Assessment              Admission diagnosis:  Supervision of high risk pregnancy in third trimester [O09.93] Postoperative state [Z98.890] Patient Active Problem List   Diagnosis Date Noted   Supervision of high risk pregnancy in third trimester 01/18/2022   Postoperative state 01/18/2022   Complete placenta previa nos or without hemorrhage, third trimester 11/28/2016   PCP:  [employer], [place]:   [place], [place] [address] Phone: [phone] Fax: [phone]     Social Determinants of Health (SDOH) Interventions    Readmission Risk Interventions  View : No data to display.

## 2022-07-12 IMAGING — US US OB COMP +14 WK
1 series · 15 of 28 positions shown · non-contrast
Comparison: none

CLINICAL DATA: Anatomy evaluation

EXAM:
OBSTETRICAL ULTRASOUND >14 WKS

[Series 1: us ob comp + 14 wk · 15 of 89 slices shown]
[im 1/89]
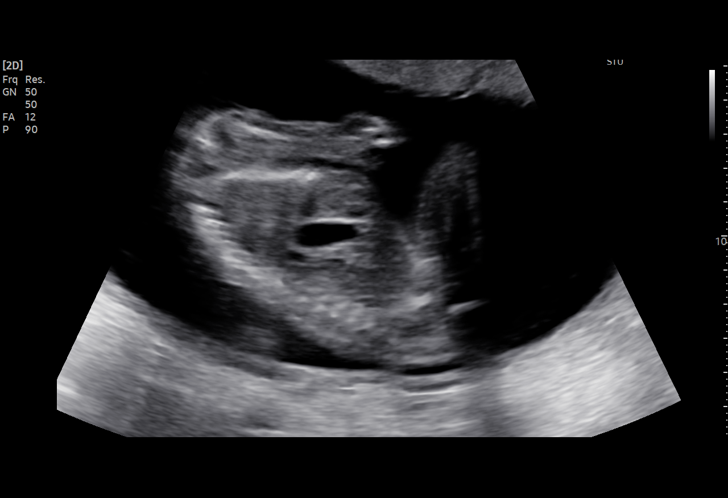
[im 7/89]
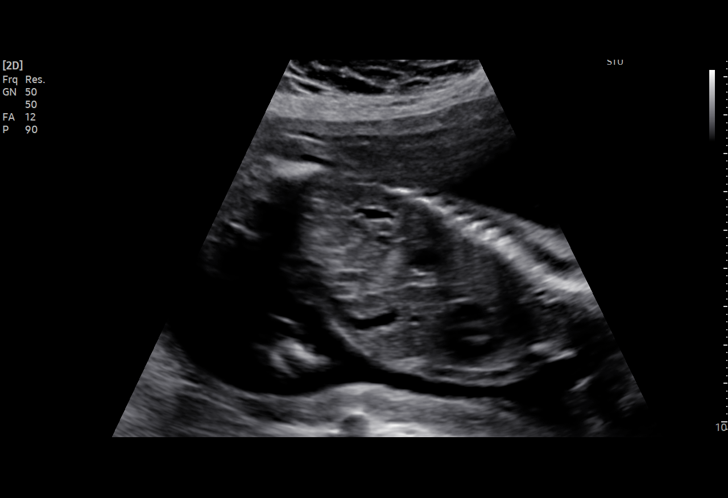
[im 14/89]
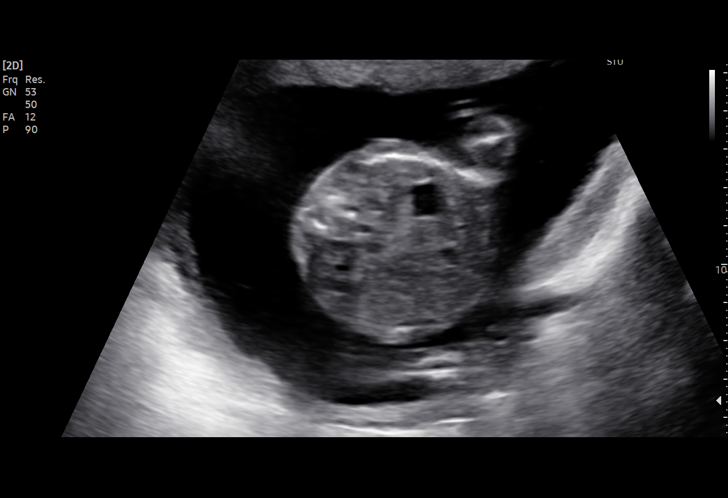
[im 20/89]
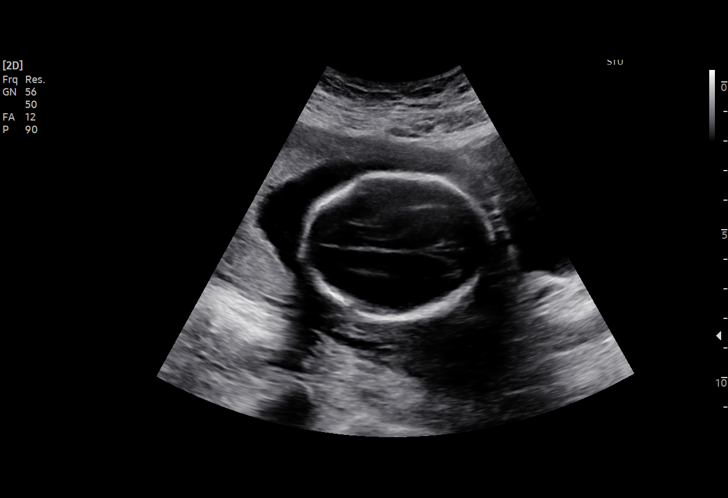
[im 27/89]
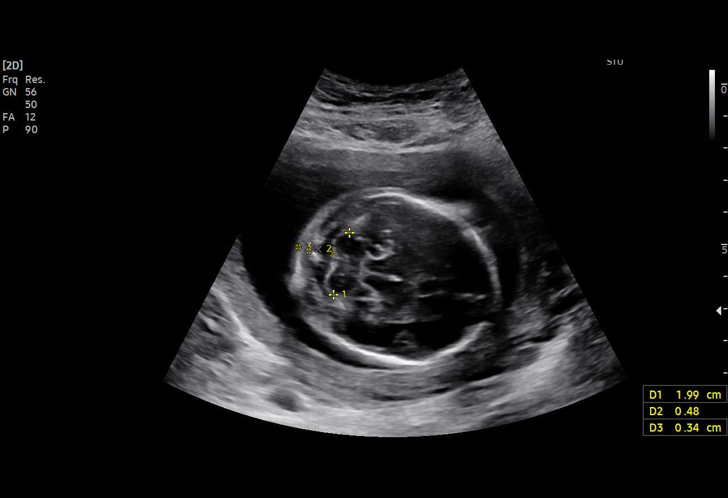
[im 33/89]
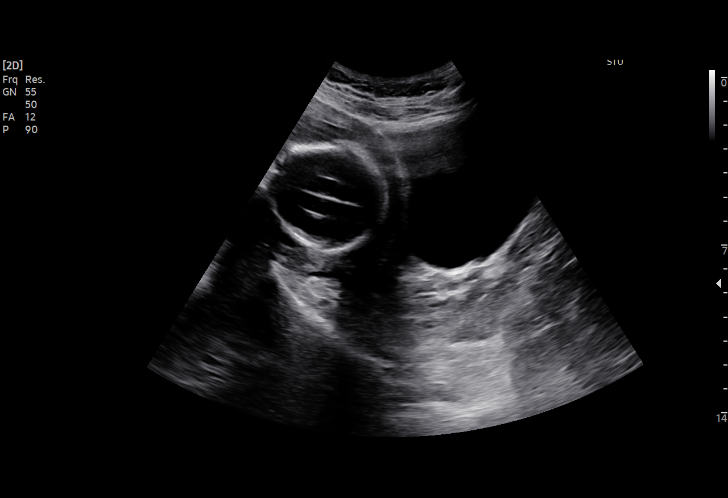
[im 40/89]
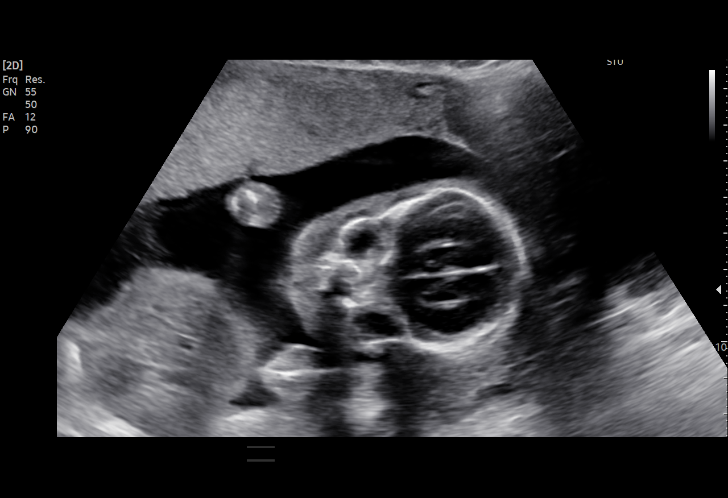
[im 46/89]
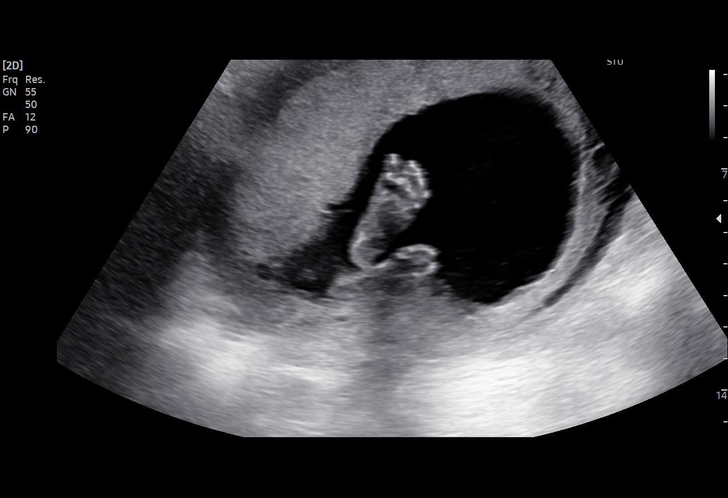
[im 49/89]
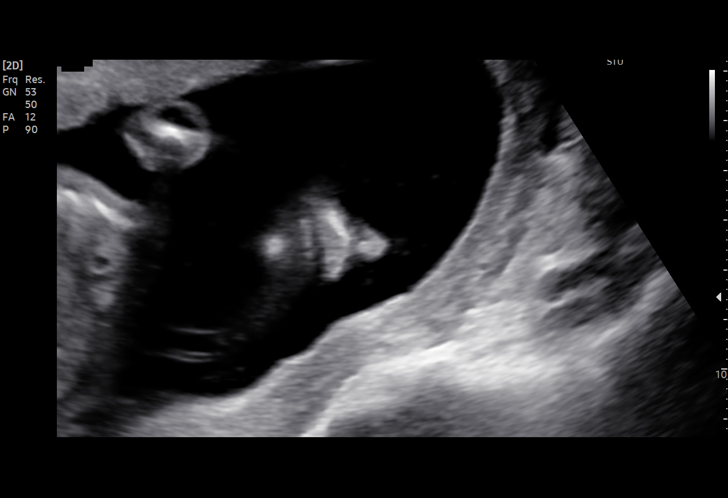
[im 56/89]
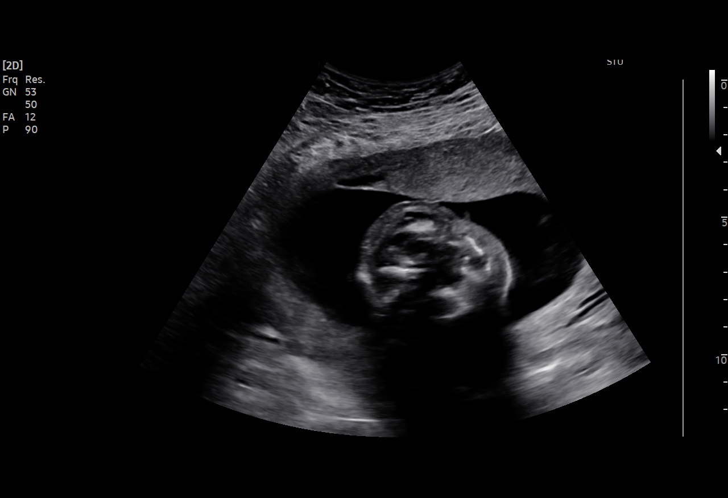
[im 62/89]
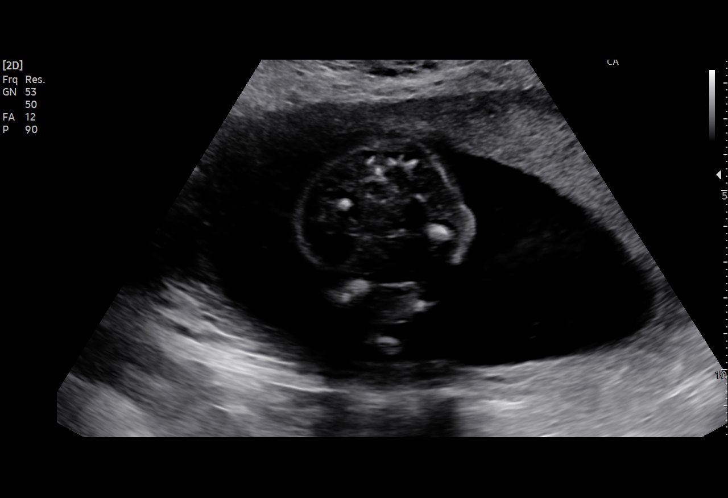
[im 69/89]
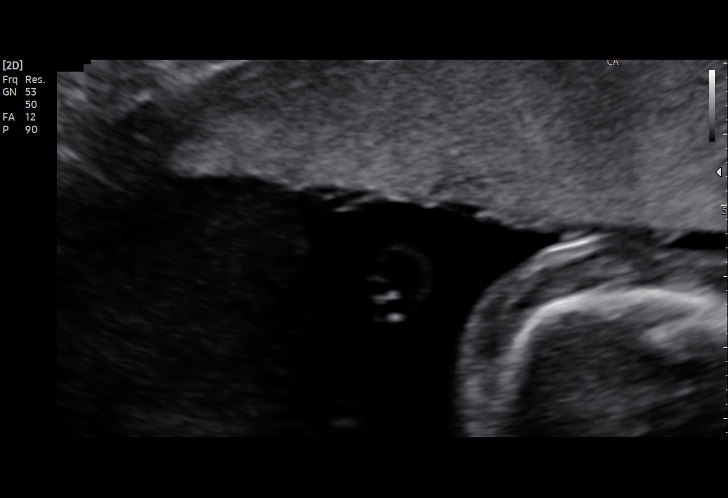
[im 75/89]
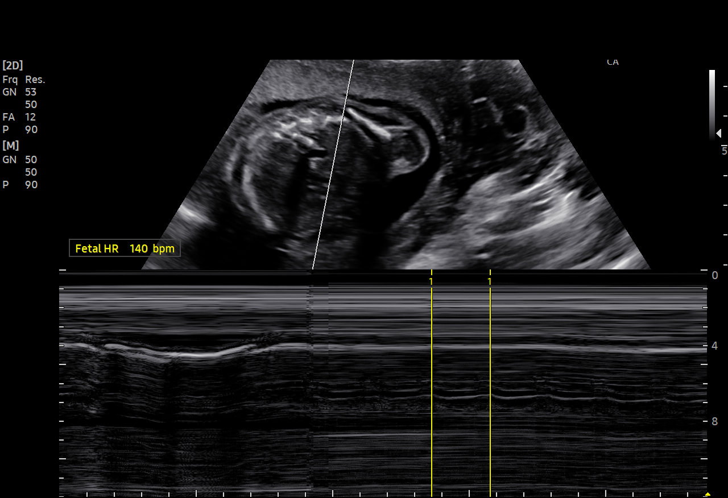
[im 82/89]
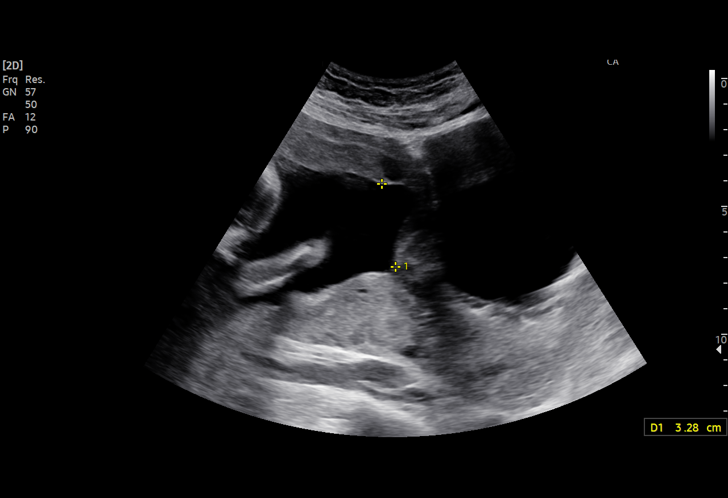
[im 89/89]
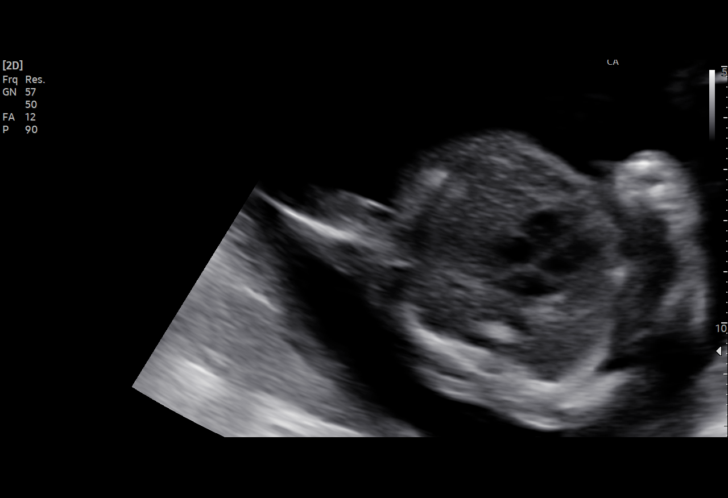

[15 of 28 positions shown; findings below may reference images not displayed]

FINDINGS: Number of Fetuses: 1

Heart Rate:  144 bpm

Movement: Yes

Presentation: Cephalic

Previa: No

Placental Location: Anterior

Amniotic Fluid (Subjective): Normal

Amniotic Fluid (Objective):

Vertical pocket = 6cm

FETAL BIOMETRY

BPD: 4.92cm 20w 6d

HC:   18.66cm 21w 0d

AC:   16.03cm 21w 1d

FL:   3.56cm 21w 2d

Current Mean GA: 21w 2d US EDC: 01/21/2022

Assigned GA:  21w 4d Assigned EDC: 01/19/2022

FETAL ANATOMY

Lateral Ventricles: Appears normal

Thalami/CSP: Appears normal

Posterior Fossa:  Appears normal

Nuchal Region: Appears normal   NFT= N/A > 20 WKS

Upper Lip: Appears normal

Spine: Appears normal

4 Chamber Heart on Left: Appears normal

LVOT: Appears normal

RVOT: Appears normal

Stomach on Left: Appears normal

3 Vessel Cord: Appears normal

Cord Insertion site: Appears normal

Kidneys: Appears normal

Bladder: Appears normal

Extremities: Appears normal

Sex: Male

Technically difficult due to: None

Maternal Findings:

Cervix:  4.6 cm. Closed.
IMPRESSION: 1. Single live intrauterine pregnancy as detailed above.
